# Patient Record
Sex: Female | Born: 1990 | Race: Black or African American | Hispanic: No | Marital: Single | Smoking: Former smoker
Health system: Southern US, Community
[De-identification: ages and names within clinical notes are randomized; demographics above are authoritative.]

## PROBLEM LIST (undated history)

## (undated) ENCOUNTER — Ambulatory Visit: Admission: EM | Payer: Medicaid Other

## (undated) ENCOUNTER — Inpatient Hospital Stay (HOSPITAL_COMMUNITY): Payer: Self-pay

## (undated) DIAGNOSIS — D563 Thalassemia minor: Secondary | ICD-10-CM

## (undated) DIAGNOSIS — J45909 Unspecified asthma, uncomplicated: Secondary | ICD-10-CM

## (undated) DIAGNOSIS — I10 Essential (primary) hypertension: Secondary | ICD-10-CM

## (undated) DIAGNOSIS — J45990 Exercise induced bronchospasm: Secondary | ICD-10-CM

## (undated) DIAGNOSIS — L309 Dermatitis, unspecified: Secondary | ICD-10-CM

## (undated) DIAGNOSIS — R569 Unspecified convulsions: Secondary | ICD-10-CM

## (undated) DIAGNOSIS — Z87898 Personal history of other specified conditions: Secondary | ICD-10-CM

## (undated) HISTORY — PX: NO PAST SURGERIES: SHX2092

## (undated) HISTORY — DX: Unspecified convulsions: R56.9

---
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
## 2011-09-03 LAB — WOUND CULTURE

## 2012-08-08 ENCOUNTER — Inpatient Hospital Stay (HOSPITAL_COMMUNITY): Payer: Medicaid Other

## 2012-08-08 ENCOUNTER — Encounter (HOSPITAL_COMMUNITY): Payer: Self-pay | Admitting: *Deleted

## 2012-08-08 ENCOUNTER — Inpatient Hospital Stay (HOSPITAL_COMMUNITY)
Admission: AD | Admit: 2012-08-08 | Discharge: 2012-08-08 | Disposition: A | Discharge status: Home / Self Care | Payer: Medicaid Other | Source: Ambulatory Visit | Attending: Obstetrics & Gynecology | Admitting: Obstetrics & Gynecology

## 2012-08-08 DIAGNOSIS — O418X90 Other specified disorders of amniotic fluid and membranes, unspecified trimester, not applicable or unspecified: Secondary | ICD-10-CM

## 2012-08-08 DIAGNOSIS — O209 Hemorrhage in early pregnancy, unspecified: Secondary | ICD-10-CM | POA: Insufficient documentation

## 2012-08-08 DIAGNOSIS — O3410 Maternal care for benign tumor of corpus uteri, unspecified trimester: Secondary | ICD-10-CM | POA: Insufficient documentation

## 2012-08-08 DIAGNOSIS — Z3490 Encounter for supervision of normal pregnancy, unspecified, unspecified trimester: Secondary | ICD-10-CM

## 2012-08-08 DIAGNOSIS — D259 Leiomyoma of uterus, unspecified: Secondary | ICD-10-CM

## 2012-08-08 DIAGNOSIS — Z1389 Encounter for screening for other disorder: Secondary | ICD-10-CM

## 2012-08-08 DIAGNOSIS — R109 Unspecified abdominal pain: Secondary | ICD-10-CM | POA: Insufficient documentation

## 2012-08-08 HISTORY — DX: Unspecified asthma, uncomplicated: J45.909

## 2012-08-08 LAB — CBC WITH DIFFERENTIAL/PLATELET
Basophils Absolute: 0 10*3/uL (ref 0.0–0.1)
Basophils Relative: 0 % (ref 0–1)
Eosinophils Absolute: 0.2 10*3/uL (ref 0.0–0.7)
HCT: 39.5 % (ref 36.0–46.0)
Hemoglobin: 12.6 g/dL (ref 12.0–15.0)
MCHC: 31.9 g/dL (ref 30.0–36.0)
Monocytes Relative: 10 % (ref 3–12)
Neutrophils Relative %: 55 % (ref 43–77)
Platelets: 291 10*3/uL (ref 150–400)
RBC: 4.51 MIL/uL (ref 3.87–5.11)
RDW: 13.1 % (ref 11.5–15.5)
WBC: 6.7 10*3/uL (ref 4.0–10.5)

## 2012-08-08 LAB — URINALYSIS, ROUTINE W REFLEX MICROSCOPIC
Glucose, UA: NEGATIVE mg/dL
Ketones, ur: NEGATIVE mg/dL
Leukocytes, UA: NEGATIVE
Nitrite: NEGATIVE
Urobilinogen, UA: 0.2 mg/dL (ref 0.0–1.0)
pH: 6.5 (ref 5.0–8.0)

## 2012-08-08 LAB — WET PREP, GENITAL
Clue Cells Wet Prep HPF POC: NONE SEEN
Trich, Wet Prep: NONE SEEN
WBC, Wet Prep HPF POC: NONE SEEN
Yeast Wet Prep HPF POC: NONE SEEN

## 2012-08-08 LAB — POCT PREGNANCY, URINE: Preg Test, Ur: POSITIVE — AB

## 2012-08-08 LAB — HCG, QUANTITATIVE, PREGNANCY: hCG, Beta Chain, Quant, S: 9849 m[IU]/mL — ABNORMAL HIGH (ref <5)

## 2012-08-08 NOTE — ED Provider Notes (Signed)
History     CSN: [id number]  Arrival date and time: 08/08/12 1138   First Provider Initiated Contact with Patient 08/08/12 1224      Chief Complaint  Patient presents with  . Possible Pregnancy   HPI Pt is a 20 year old g1p0 at 6.0 weeks by LMP who came to the [facility] complaining of no period since Augst 8th, mild crampy abdominal pain, nausea, heartburn, and constipation since the end of august.  She had taken two at home pregnancy tests with one being positive and the other being unreadable. She reports no bleeding, dizzyness, vomiting, fevers, headache, vision changes, or numbness.    OB History    Grav Para Term Preterm Abortions TAB SAB Ect Mult Living   1               Past Medical History  Diagnosis Date  . Asthma     exercise induced    Past Surgical History  Procedure Date  . No past surgeries     History reviewed. No pertinent family history.  History  Substance Use Topics  . Smoking status: Current Every Day Smoker -- 0.2 packs/day    Types: Cigarettes  . Smokeless tobacco: Not on file  . Alcohol Use: No    Allergies: No Known Allergies  No prescriptions prior to admission    ROS See HPI Physical Exam   Blood pressure 138/79, pulse 97, temperature 99.1 F (37.3 C), temperature source Oral, resp. rate 16, height 5\' 6"  (1.676 m), weight 175 lb 9.6 oz (79.652 kg), last menstrual period 06/27/2012, SpO2 100.00%.  Physical Exam  Constitutional: She is oriented to person, place, and time. Vital signs are normal. She appears well-developed and well-nourished. No distress.  HENT:  Head: Normocephalic and atraumatic.  Right Ear: External ear normal.  Left Ear: External ear normal.  Nose: Nose normal.  Eyes: Conjunctivae normal and EOM are normal. Pupils are equal, round, and reactive to light.  Cardiovascular: Normal rate, regular rhythm, S1 normal, S2 normal, normal heart sounds and normal pulses.  Exam reveals no gallop, no S3, no S4 and no friction rub.    No murmur heard.      Unable to palpate pedal pulse, good capillary refill <2 seconds  Respiratory: Effort normal and breath sounds normal. No respiratory distress. She has no wheezes. She has no rales. She exhibits no tenderness.  GI: Soft. Normal appearance and bowel sounds are normal. She exhibits no distension, no pulsatile liver, no abdominal bruit and no mass. There is no hepatosplenomegaly. There is no tenderness. There is no rigidity, no rebound and no guarding.  Genitourinary: Vagina normal and uterus normal. Pelvic exam was performed with patient supine.       External vagina normal with no lesions. Vaginal vault contains thick white discharge, no visible blood or trauma noted.  Cervix is closed and long with no lesions. No adnexal tenderness. Uterus is approx 6 weeks in size. Performed with chaperone.  Neurological: She is alert and oriented to person, place, and time.  Skin: Skin is warm and dry. No rash noted. She is not diaphoretic. No erythema. No pallor.   Results for orders placed during the hospital encounter of 08/08/12 (from the past 24 hour(s))  URINALYSIS, ROUTINE W REFLEX MICROSCOPIC     Status: Normal   Collection Time   08/08/12 11:52 AM      Component Value Range   Color, Urine YELLOW  YELLOW   APPearance CLEAR  CLEAR  Specific Gravity, Urine 1.015  1.005 - 1.030   pH 6.5  5.0 - 8.0   Glucose, UA NEGATIVE  NEGATIVE mg/dL   Hgb urine dipstick NEGATIVE  NEGATIVE   Bilirubin Urine NEGATIVE  NEGATIVE   Ketones, ur NEGATIVE  NEGATIVE mg/dL   Protein, ur NEGATIVE  NEGATIVE mg/dL   Urobilinogen, UA 0.2  0.0 - 1.0 mg/dL   Nitrite NEGATIVE  NEGATIVE   Leukocytes, UA NEGATIVE  NEGATIVE  POCT PREGNANCY, URINE     Status: Abnormal   Collection Time   08/08/12 11:59 AM      Component Value Range   Preg Test, Ur POSITIVE (*) NEGATIVE  CBC WITH DIFFERENTIAL     Status: Normal   Collection Time   08/08/12 12:35 PM      Component Value Range   WBC 6.7  4.0 - 10.5 K/uL    RBC 4.51  3.87 - 5.11 MIL/uL   Hemoglobin 12.6  12.0 - 15.0 g/dL   HCT 16.1  09.6 - 04.5 %   MCV 87.6  78.0 - 100.0 fL   MCH 27.9  26.0 - 34.0 pg   MCHC 31.9  30.0 - 36.0 g/dL   RDW 40.9  81.1 - 91.4 %   Platelets 291  150 - 400 K/uL   Neutrophils Relative 55  43 - 77 %   Neutro Abs 3.7  1.7 - 7.7 K/uL   Lymphocytes Relative 31  12 - 46 %   Lymphs Abs 2.1  0.7 - 4.0 K/uL   Monocytes Relative 10  3 - 12 %   Monocytes Absolute 0.7  0.1 - 1.0 K/uL   Eosinophils Relative 4  0 - 5 %   Eosinophils Absolute 0.2  0.0 - 0.7 K/uL   Basophils Relative 0  0 - 1 %   Basophils Absolute 0.0  0.0 - 0.1 K/uL  HCG, QUANTITATIVE, PREGNANCY     Status: Abnormal   Collection Time   08/08/12 12:35 PM      Component Value Range   hCG, Beta Chain, Quant, S 9849 (*) <5 mIU/mL  ABO/RH     Status: Normal   Collection Time   08/08/12 12:35 PM      Component Value Range   ABO/RH(D) O POS    WET PREP, GENITAL     Status: Normal   Collection Time   08/08/12  1:53 PM      Component Value Range   Yeast Wet Prep HPF POC NONE SEEN  NONE SEEN   Trich, Wet Prep NONE SEEN  NONE SEEN   Clue Cells Wet Prep HPF POC NONE SEEN  NONE SEEN   WBC, Wet Prep HPF POC NONE SEEN  NONE SEEN    MAU Course  Procedures  US shows 6 week IUP with cardiac activity, small fibroid and small [facility] Pelvic exam with CG/Chlamydia + wet prep  Assessment and Plan  IUP @ 6 weeks. Informed of incidental finding if 1cm fibroid and small subchorionic hemorrhage. Follow up for prenatal care.   Doran Heater 08/08/2012, 1:58 PM   I have examined the patient with the student and assisted him with his dx and plan of care.  Discussed with the patient and all questioned fully answered. She will return if any problems arise. I have reviewed this patient's vital signs, nurses notes, appropriate labs and imaging.      [facility] Orlene Och, NP 08/08/12 1600

## 2012-08-08 NOTE — MAU Note (Signed)
Patient states she is late for her period. Nausea, no vomiting. Had some spotting in late August, none since. Has some pain on and off, none now. Just not feeling well.

## 2012-08-08 NOTE — MAU Note (Signed)
Patient states she did 2 home pregnancy tests yesterday, one positive and unable to determine with the 2nd.

## 2012-09-27 ENCOUNTER — Other Ambulatory Visit (HOSPITAL_COMMUNITY): Payer: Self-pay | Admitting: Physician Assistant

## 2012-09-27 DIAGNOSIS — Z3689 Encounter for other specified antenatal screening: Secondary | ICD-10-CM

## 2012-09-27 LAB — OB RESULTS CONSOLE ABO/RH: RH Type: POSITIVE

## 2012-09-27 LAB — OB RESULTS CONSOLE HIV ANTIBODY (ROUTINE TESTING): HIV: NONREACTIVE

## 2012-11-08 ENCOUNTER — Ambulatory Visit (HOSPITAL_COMMUNITY)
Admission: RE | Admit: 2012-11-08 | Discharge: 2012-11-08 | Disposition: A | Discharge status: Home / Self Care | Payer: Medicaid Other | Source: Ambulatory Visit | Attending: Physician Assistant | Admitting: Physician Assistant

## 2012-11-08 DIAGNOSIS — Z1389 Encounter for screening for other disorder: Secondary | ICD-10-CM | POA: Insufficient documentation

## 2012-11-08 DIAGNOSIS — O358XX0 Maternal care for other (suspected) fetal abnormality and damage, not applicable or unspecified: Secondary | ICD-10-CM | POA: Insufficient documentation

## 2012-11-08 DIAGNOSIS — Z363 Encounter for antenatal screening for malformations: Secondary | ICD-10-CM | POA: Insufficient documentation

## 2012-11-08 DIAGNOSIS — Z3689 Encounter for other specified antenatal screening: Secondary | ICD-10-CM

## 2013-03-07 LAB — OB RESULTS CONSOLE GBS: GBS: POSITIVE

## 2013-04-03 ENCOUNTER — Encounter (HOSPITAL_COMMUNITY): Payer: Self-pay | Admitting: *Deleted

## 2013-04-03 ENCOUNTER — Telehealth (HOSPITAL_COMMUNITY): Payer: Self-pay | Admitting: *Deleted

## 2013-04-03 NOTE — Telephone Encounter (Signed)
Preadmission screen  

## 2013-04-05 ENCOUNTER — Inpatient Hospital Stay (HOSPITAL_COMMUNITY)
Admission: AD | Admit: 2013-04-05 | Discharge: 2013-04-09 | DRG: 766 | Disposition: A | Discharge status: Home / Self Care | Payer: Medicaid Other | Source: Ambulatory Visit | Attending: Obstetrics and Gynecology | Admitting: Obstetrics and Gynecology

## 2013-04-05 ENCOUNTER — Encounter (HOSPITAL_COMMUNITY): Payer: Self-pay | Admitting: *Deleted

## 2013-04-05 DIAGNOSIS — Z22330 Carrier of Group B streptococcus: Secondary | ICD-10-CM

## 2013-04-05 DIAGNOSIS — O4290 Premature rupture of membranes, unspecified as to length of time between rupture and onset of labor, unspecified weeks of gestation: Principal | ICD-10-CM | POA: Diagnosis present

## 2013-04-05 DIAGNOSIS — O99892 Other specified diseases and conditions complicating childbirth: Secondary | ICD-10-CM | POA: Diagnosis present

## 2013-04-05 DIAGNOSIS — O34219 Maternal care for unspecified type scar from previous cesarean delivery: Secondary | ICD-10-CM

## 2013-04-05 LAB — URINALYSIS, ROUTINE W REFLEX MICROSCOPIC
Bilirubin Urine: NEGATIVE
Hgb urine dipstick: NEGATIVE
Specific Gravity, Urine: 1.015 (ref 1.005–1.030)
Urobilinogen, UA: 0.2 mg/dL (ref 0.0–1.0)

## 2013-04-05 LAB — CBC
HCT: 40 % (ref 36.0–46.0)
MCHC: 33.3 g/dL (ref 30.0–36.0)
Platelets: 295 10*3/uL (ref 150–400)
RDW: 13.6 % (ref 11.5–15.5)
WBC: 9.6 10*3/uL (ref 4.0–10.5)

## 2013-04-05 LAB — COMPREHENSIVE METABOLIC PANEL
ALT: 21 U/L (ref 0–35)
Albumin: 2.6 g/dL — ABNORMAL LOW (ref 3.5–5.2)
Alkaline Phosphatase: 153 U/L — ABNORMAL HIGH (ref 39–117)
Chloride: 102 mEq/L (ref 96–112)
Glucose, Bld: 130 mg/dL — ABNORMAL HIGH (ref 70–99)
Potassium: 3.9 mEq/L (ref 3.5–5.1)
Sodium: 134 mEq/L — ABNORMAL LOW (ref 135–145)
Total Bilirubin: 0.3 mg/dL (ref 0.3–1.2)
Total Protein: 5.9 g/dL — ABNORMAL LOW (ref 6.0–8.3)

## 2013-04-05 LAB — TYPE AND SCREEN
ABO/RH(D): O POS
Antibody Screen: NEGATIVE

## 2013-04-05 MED ORDER — TERBUTALINE SULFATE 1 MG/ML IJ SOLN: 0.2500 mg | Freq: Once | INTRAMUSCULAR | Status: AC | PRN

## 2013-04-05 MED ORDER — ONDANSETRON HCL 4 MG/2ML IJ SOLN
4.0000 mg | Freq: Four times a day (QID) | INTRAMUSCULAR | Status: DC | PRN
  Administered 2013-04-06 – 2013-04-07 (×3): 4 mg via INTRAVENOUS
  Filled 2013-04-05 (×3): qty 2

## 2013-04-05 MED ORDER — PENICILLIN G POTASSIUM 5000000 UNITS IJ SOLR
5.0000 10*6.[IU] | Freq: Once | INTRAVENOUS | Status: AC
  Administered 2013-04-05: 5 10*6.[IU] via INTRAVENOUS
  Filled 2013-04-05: qty 5

## 2013-04-05 MED ORDER — OXYTOCIN BOLUS FROM INFUSION: 500.0000 mL | INTRAVENOUS | Status: DC

## 2013-04-05 MED ORDER — ZOLPIDEM TARTRATE 5 MG PO TABS
5.0000 mg | ORAL_TABLET | Freq: Every evening | ORAL | Status: DC | PRN
  Administered 2013-04-06 (×2): 5 mg via ORAL
  Filled 2013-04-05 (×2): qty 1

## 2013-04-05 MED ORDER — ACETAMINOPHEN 325 MG PO TABS: 650.0000 mg | ORAL_TABLET | ORAL | Status: DC | PRN

## 2013-04-05 MED ORDER — LIDOCAINE HCL (PF) 1 % IJ SOLN: 30.0000 mL | INTRAMUSCULAR | Status: DC | PRN

## 2013-04-05 MED ORDER — OXYTOCIN 40 UNITS IN LACTATED RINGERS INFUSION - SIMPLE MED: 62.5000 mL/h | INTRAVENOUS | Status: DC

## 2013-04-05 MED ORDER — CITRIC ACID-SODIUM CITRATE 334-500 MG/5ML PO SOLN
30.0000 mL | ORAL | Status: DC | PRN
  Administered 2013-04-06 – 2013-04-07 (×3): 30 mL via ORAL
  Filled 2013-04-05 (×3): qty 15

## 2013-04-05 MED ORDER — MISOPROSTOL 25 MCG QUARTER TABLET
50.0000 ug | ORAL_TABLET | ORAL | Status: DC
  Administered 2013-04-05: 50 ug via ORAL
  Filled 2013-04-05 (×2): qty 0.25

## 2013-04-05 MED ORDER — OXYCODONE-ACETAMINOPHEN 5-325 MG PO TABS: 1.0000 | ORAL_TABLET | ORAL | Status: DC | PRN

## 2013-04-05 MED ORDER — IBUPROFEN 600 MG PO TABS: 600.0000 mg | ORAL_TABLET | Freq: Four times a day (QID) | ORAL | Status: DC | PRN

## 2013-04-05 MED ORDER — PENICILLIN G POTASSIUM 5000000 UNITS IJ SOLR
2.5000 10*6.[IU] | INTRAMUSCULAR | Status: DC
  Administered 2013-04-06 – 2013-04-07 (×10): 2.5 10*6.[IU] via INTRAVENOUS
  Filled 2013-04-05 (×14): qty 2.5

## 2013-04-05 MED ORDER — FENTANYL 2.5 MCG/ML BUPIVACAINE 1/10 % EPIDURAL INFUSION (WH - ANES)
14.0000 mL/h | INTRAMUSCULAR | Status: DC | PRN
  Administered 2013-04-07 (×2): 14 mL/h via EPIDURAL
  Filled 2013-04-05 (×3): qty 125

## 2013-04-05 MED ORDER — EPHEDRINE 5 MG/ML INJ: 10.0000 mg | INTRAVENOUS | Status: DC | PRN

## 2013-04-05 MED ORDER — PHENYLEPHRINE 40 MCG/ML (10ML) SYRINGE FOR IV PUSH (FOR BLOOD PRESSURE SUPPORT): 80.0000 ug | PREFILLED_SYRINGE | INTRAVENOUS | Status: DC | PRN

## 2013-04-05 MED ORDER — FLEET ENEMA 7-19 GM/118ML RE ENEM: 1.0000 | ENEMA | RECTAL | Status: DC | PRN

## 2013-04-05 MED ORDER — EPHEDRINE 5 MG/ML INJ
10.0000 mg | INTRAVENOUS | Status: DC | PRN
  Filled 2013-04-05: qty 4

## 2013-04-05 MED ORDER — LACTATED RINGERS IV SOLN: 500.0000 mL | Freq: Once | INTRAVENOUS | Status: DC

## 2013-04-05 MED ORDER — FENTANYL CITRATE 0.05 MG/ML IJ SOLN
100.0000 ug | INTRAMUSCULAR | Status: DC | PRN
  Administered 2013-04-06 (×4): 100 ug via INTRAVENOUS
  Filled 2013-04-05 (×5): qty 2

## 2013-04-05 MED ORDER — LACTATED RINGERS IV SOLN
500.0000 mL | INTRAVENOUS | Status: DC | PRN
  Administered 2013-04-07 (×2): 500 mL via INTRAVENOUS
  Administered 2013-04-07: 1000 mL via INTRAVENOUS

## 2013-04-05 MED ORDER — DIPHENHYDRAMINE HCL 50 MG/ML IJ SOLN: 12.5000 mg | INTRAMUSCULAR | Status: DC | PRN

## 2013-04-05 MED ORDER — LACTATED RINGERS IV SOLN
INTRAVENOUS | Status: DC
  Administered 2013-04-05 – 2013-04-06 (×2): via INTRAVENOUS
  Administered 2013-04-06: 125 mL/h via INTRAVENOUS
  Administered 2013-04-07 (×2): via INTRAVENOUS

## 2013-04-05 MED ORDER — PHENYLEPHRINE 40 MCG/ML (10ML) SYRINGE FOR IV PUSH (FOR BLOOD PRESSURE SUPPORT)
80.0000 ug | PREFILLED_SYRINGE | INTRAVENOUS | Status: DC | PRN
  Filled 2013-04-05: qty 5

## 2013-04-05 NOTE — MAU Provider Note (Signed)
  History     CSN: [id number]  Arrival date and time: 04/05/13 1824   None     No chief complaint on file.  HPI Alison Archer is a 22 y.o. female G1P0 who presents today concerned for her water breaking.  She states that she has felt an increasing discharge for the last two days.  [facility] she was convinced to present now.  She admits to feeling the baby move regularly; she denies cramping or contractions and denies vaginal bleeding.    OB History   Grav Para Term Preterm Abortions TAB SAB Ect Mult Living   1               Past Medical History  Diagnosis Date  . Asthma     exercise induced  . Seizures     febrile as a child    Past Surgical History  Procedure Laterality Date  . No past surgeries      Family History  Problem Relation Age of Onset  . Hypertension Mother   . Hypertension Father   . Asthma Sister   . Hypertension Sister   . Diabetes Maternal Grandmother     History  Substance Use Topics  . Smoking status: Former Smoker -- 0.25 packs/day    Types: Cigarettes    Quit date: 08/04/2012  . Smokeless tobacco: Not on file  . Alcohol Use: No    Allergies: No Known Allergies  Prescriptions prior to admission  Medication Sig Dispense Refill  . Prenatal Vit-Fe Fumarate-FA (PRENATAL MULTIVITAMIN) TABS Take 1 tablet by mouth daily at 12 noon.        Review of Systems  Constitutional: Negative for fever and chills.  Respiratory: Negative for cough.   Cardiovascular: Negative for chest pain.  Gastrointestinal: Negative for nausea, vomiting and abdominal pain.  Neurological: Negative for headaches.   Physical Exam   Last menstrual period 06/27/2012, unknown if currently breastfeeding.  Physical Exam  Genitourinary: There is no rash, tenderness, lesion or injury on the right labia. There is no rash, tenderness, lesion or injury on the left labia. No vaginal discharge found.  There is a clear mucus discharge  from the cervix.      MAU Course  Procedures  Sample of the cervical discharge under microscope revealed ferning Point of Care ultrasound in room confirmed vertex presentation.   Assessment and Plan  PROM- 1) Admit to L&D for induction of labor 2) Start cytotec oral   Anna Genre 04/05/2013, 7:31 PM

## 2013-04-05 NOTE — H&P (Signed)
Alison Archer is a 22 y.o. female presenting for "Leaking fluid for 2 days,off and on".  Finally called MD, and was advised to come in. Mild contractions q 10-13 minutes. Maternal Medical History:  Reason for admission: Rupture of membranes.   Fetal activity: Perceived fetal activity is normal.      OB History   Grav Para Term Preterm Abortions TAB SAB Ect Mult Living   1              Past Medical History  Diagnosis Date  . Asthma     exercise induced  . Seizures     febrile as a child   Past Surgical History  Procedure Laterality Date  . No past surgeries     Family History: family history includes Asthma in her sister; Diabetes in her maternal grandmother; and Hypertension in her father, mother, and sister. Social History:  reports that she quit smoking about 8 months ago. Her smoking use included Cigarettes. She smoked 0.25 packs per day. She does not have any smokeless tobacco history on file. She reports that she uses illicit drugs (Marijuana). She reports that she does not drink alcohol.   Prenatal Transfer Tool  Maternal Diabetes: No Genetic Screening: Normal Maternal Ultrasounds/Referrals: Normal Fetal Ultrasounds or other Referrals:  None Maternal Substance Abuse:  No Significant Maternal Medications:  None Significant Maternal Lab Results:  Lab values include: Group B Strep positive Other Comments:  PROM since 04/03/13  Review of Systems  Constitutional: Negative.   Eyes: Negative.  Negative for blurred vision.  Respiratory: Negative.   Cardiovascular: Positive for leg swelling.  Gastrointestinal: Negative.   Genitourinary: Negative.   Musculoskeletal: Negative.   Neurological: Negative.  Negative for headaches.  Endo/Heme/Allergies: Negative.   Psychiatric/Behavioral: Negative.       Blood pressure 137/85, pulse 90, last menstrual period 06/27/2012, unknown if currently breastfeeding.   Fetal Exam Fetal Monitor Review: Baseline rate: 130.   Variability: moderate (6-25 bpm).   Pattern: accelerations present and no decelerations.    Fetal State Assessment: Category I - tracings are normal.     Physical Exam  Constitutional: She is oriented to person, place, and time. She appears well-developed and well-nourished.  Neck: No thyromegaly present.  Cardiovascular: Normal rate, regular rhythm and normal heart sounds.   No murmur heard. Respiratory: Breath sounds normal.  Genitourinary:  Clear fluid, positive ferning  Musculoskeletal: She exhibits edema.  2+ pitting lower legs and feet, mild swelling of hands  Neurological: She is alert and oriented to person, place, and time. She has normal reflexes.  Skin: Skin is warm and dry.  Psychiatric: She has a normal mood and affect. Her behavior is normal. Judgment and thought content normal.    Prenatal labs: ABO, Rh: O/Positive/-- (11/06 0000) Antibody: Negative (11/06 0000) Rubella: Immune (11/06 0000) RPR: Nonreactive (11/06 0000)  HBsAg: Negative (11/06 0000)  HIV: Non-reactive (11/06 0000)  GBS: Positive (04/16 0000)  1hr GTT: 85 Assessment/Plan: PROM X 2 days Cytotec orally   CRESENZO-DISHMAN,Rochele Lueck 04/05/2013, 8:00 PM

## 2013-04-05 NOTE — Progress Notes (Signed)
Alison Archer is a 22 y.o. G1P0 at [redacted]w[redacted]d  admitted for PROM X 2 days  Subjective: Starting to feel mild contractions  Objective: BP 154/88  Pulse 85  Temp(Src) 99.2 F (37.3 C) (Oral)  Resp 18  Ht 5\' 5"  (1.651 m)  Wt 104.327 kg (230 lb)  BMI 38.27 kg/m2  LMP 06/27/2012  Breastfeeding? Unknown   Filed Vitals:   04/05/13 1854 04/05/13 1933 04/05/13 2036 04/05/13 2040  BP: 140/89 137/85 154/88 154/88  Pulse: 99 90 85 85  Temp:    99.2 F (37.3 C)  TempSrc:    Oral  Resp:    18  Height:    5\' 5"  (1.651 m)  Weight:    104.327 kg (230 lb)    FHT:  FHR: 130 bpm, variability: moderate,  accelerations:  Present,  decelerations:  Absent UC:   irregular, every 5-10 minutes SVE:   Dilation: Closed Effacement (%): 0 Station: -3 Exam by:: GPayne, RN S/P 1 cytotec Labs:  Results for orders placed during the hospital encounter of 04/05/13 (from the past 24 hour(s))  URINALYSIS, ROUTINE W REFLEX MICROSCOPIC     Status: Abnormal   Collection Time    04/05/13  8:10 PM      Result Value Range   Color, Urine YELLOW  YELLOW   APPearance CLEAR  CLEAR   Specific Gravity, Urine 1.015  1.005 - 1.030   pH 6.5  5.0 - 8.0   Glucose, UA NEGATIVE  NEGATIVE mg/dL   Hgb urine dipstick NEGATIVE  NEGATIVE   Bilirubin Urine NEGATIVE  NEGATIVE   Ketones, ur 15 (*) NEGATIVE mg/dL   Protein, ur NEGATIVE  NEGATIVE mg/dL   Urobilinogen, UA 0.2  0.0 - 1.0 mg/dL   Nitrite NEGATIVE  NEGATIVE   Leukocytes, UA NEGATIVE  NEGATIVE  CBC     Status: None   Collection Time    04/05/13  9:00 PM      Result Value Range   WBC 9.6  4.0 - 10.5 K/uL   RBC 4.59  3.87 - 5.11 MIL/uL   Hemoglobin 13.3  12.0 - 15.0 g/dL   HCT 13.0  86.5 - 78.4 %   MCV 87.1  78.0 - 100.0 fL   MCH 29.0  26.0 - 34.0 pg   MCHC 33.3  30.0 - 36.0 g/dL   RDW 69.6  29.5 - 28.4 %   Platelets 295  150 - 400 K/uL  TYPE AND SCREEN     Status: None   Collection Time    04/05/13 10:05 PM      Result Value Range   ABO/RH(D) O POS      Antibody Screen NEG     Sample Expiration 04/08/2013    COMPREHENSIVE METABOLIC PANEL     Status: Abnormal   Collection Time    04/05/13 10:05 PM      Result Value Range   Sodium 134 (*) 135 - 145 mEq/L   Potassium 3.9  3.5 - 5.1 mEq/L   Chloride 102  96 - 112 mEq/L   CO2 23  19 - 32 mEq/L   Glucose, Bld 130 (*) 70 - 99 mg/dL   BUN 6  6 - 23 mg/dL   Creatinine, Ser 1.32  0.50 - 1.10 mg/dL   Calcium 9.4  8.4 - 44.0 mg/dL   Total Protein 5.9 (*) 6.0 - 8.3 g/dL   Albumin 2.6 (*) 3.5 - 5.2 g/dL   AST 25  0 - 37 U/L  ALT 21  0 - 35 U/L   Alkaline Phosphatase 153 (*) 39 - 117 U/L   Total Bilirubin 0.3  0.3 - 1.2 mg/dL   GFR calc non Af Amer >90  >90 mL/min   GFR calc Af Amer >90  >90 mL/min    Assessment / Plan: PROM X 2 days, IOL, continue oral cytotec  Labor: ripening phase Fetal Wellbeing:  Category I Pain Control:  Labor support without medications Anticipated MOD:  NSVD  Archer,Alison Amoroso 04/05/2013, 11:09 PM

## 2013-04-06 ENCOUNTER — Encounter (HOSPITAL_COMMUNITY): Payer: Self-pay | Admitting: *Deleted

## 2013-04-06 ENCOUNTER — Other Ambulatory Visit: Payer: Medicaid Other

## 2013-04-06 LAB — COMPREHENSIVE METABOLIC PANEL
Albumin: 2.5 g/dL — ABNORMAL LOW (ref 3.5–5.2)
Alkaline Phosphatase: 149 U/L — ABNORMAL HIGH (ref 39–117)
BUN: 4 mg/dL — ABNORMAL LOW (ref 6–23)
Potassium: 3.7 mEq/L (ref 3.5–5.1)
Sodium: 134 mEq/L — ABNORMAL LOW (ref 135–145)
Total Protein: 6.2 g/dL (ref 6.0–8.3)

## 2013-04-06 LAB — CBC
HCT: 34.6 % — ABNORMAL LOW (ref 36.0–46.0)
Hemoglobin: 11.2 g/dL — ABNORMAL LOW (ref 12.0–15.0)
MCV: 87.8 fL (ref 78.0–100.0)
RDW: 13.4 % (ref 11.5–15.5)
WBC: 9.2 10*3/uL (ref 4.0–10.5)

## 2013-04-06 LAB — RPR: RPR Ser Ql: NONREACTIVE

## 2013-04-06 LAB — PROTEIN / CREATININE RATIO, URINE: Protein Creatinine Ratio: 0.18 — ABNORMAL HIGH (ref 0.00–0.15)

## 2013-04-06 MED ORDER — OXYTOCIN 10 UNIT/ML IJ SOLN
INTRAMUSCULAR | Status: AC
  Filled 2013-04-06: qty 4

## 2013-04-06 MED ORDER — MISOPROSTOL 25 MCG QUARTER TABLET
50.0000 ug | ORAL_TABLET | Freq: Once | ORAL | Status: AC
  Administered 2013-04-06: 50 ug via ORAL
  Filled 2013-04-06: qty 0.5

## 2013-04-06 MED ORDER — MORPHINE SULFATE 0.5 MG/ML IJ SOLN
INTRAMUSCULAR | Status: AC
  Filled 2013-04-06: qty 10

## 2013-04-06 MED ORDER — MISOPROSTOL 25 MCG QUARTER TABLET: 50.0000 ug | ORAL_TABLET | Freq: Once | ORAL | Status: DC

## 2013-04-06 MED ORDER — ONDANSETRON HCL 4 MG/2ML IJ SOLN
INTRAMUSCULAR | Status: AC
  Filled 2013-04-06: qty 2

## 2013-04-06 MED ORDER — FENTANYL CITRATE 0.05 MG/ML IJ SOLN
INTRAMUSCULAR | Status: AC
  Filled 2013-04-06: qty 2

## 2013-04-06 MED ORDER — MISOPROSTOL 50MCG HALF TABLET
50.0000 ug | ORAL_TABLET | Freq: Once | ORAL | Status: AC
  Administered 2013-04-06: 50 ug via ORAL
  Filled 2013-04-06: qty 1

## 2013-04-06 NOTE — Anesthesia Preprocedure Evaluation (Addendum)
Anesthesia Evaluation  Patient identified by MRN, date of birth, ID band Patient awake    Reviewed: Allergy & Precautions, H&P , NPO status , Patient's Chart, lab work & pertinent test results  Airway Mallampati: II TM Distance: >3 FB Neck ROM: full    Dental no notable dental hx. (+) Dental Advisory Given   Pulmonary    Pulmonary exam normal       Cardiovascular negative cardio ROS      Neuro/Psych negative psych ROS   GI/Hepatic negative GI ROS, Neg liver ROS,   Endo/Other  Morbid obesity  Renal/GU negative Renal ROS  negative genitourinary   Musculoskeletal negative musculoskeletal ROS (+)   Abdominal Normal abdominal exam  (+)   Peds negative pediatric ROS (+)  Hematology negative hematology ROS (+)   Anesthesia Other Findings   Reproductive/Obstetrics (+) Pregnancy                          Anesthesia Physical Anesthesia Plan  ASA: III  Anesthesia Plan: Epidural   Post-op Pain Management:    Induction:   Airway Management Planned:   Additional Equipment:   Intra-op Plan:   Post-operative Plan:   Informed Consent: I have reviewed the patients History and Physical, chart, labs and discussed the procedure including the risks, benefits and alternatives for the proposed anesthesia with the patient or authorized representative who has indicated his/her understanding and acceptance.   Dental advisory given  Plan Discussed with: CRNA  Anesthesia Plan Comments:        Anesthesia Quick Evaluation

## 2013-04-06 NOTE — Progress Notes (Signed)
Patient ID: Alison Archer, female   DOB: 15-Mar-1991, 22 y.o.   MRN: [id number]  S:  Pt uncomfortable with contractions. S/p 3 doses PO cytotec. Still leaking fluid.  O: Filed Vitals:   04/06/13 1355 04/06/13 1400 04/06/13 1457 04/06/13 1536  BP:   134/81   Pulse:   75   Temp:   98.3 F (36.8 C)   TempSrc:   Oral   Resp: 18 20 18 20   Height:      Weight:        Cervix:  1-2/ 50% / -3 / mid  Foley bulb placed easily, filled with 60 ml LR  FHTs:  115-120, moderate variability, accels present, no decels TOCO: q 2 -4 min  A/P 21 y.o. G1P0000 at [redacted]w[redacted]d with SROM 3 days ago. IOL. - S/p 3 doses cytotec - Foley bulb placed - Contracting somewhat regularly - Anticipate SVD - Afebrile, FHTs reassuring   Napoleon Form, MD

## 2013-04-06 NOTE — Progress Notes (Signed)
Dr. Thad Ranger at bedside and explained to the pt the need to remove the foley bulb due to FHR tracing.  Pt verbalized understandings.  Will continue to monitor.

## 2013-04-06 NOTE — Progress Notes (Signed)
Patient ID: Alison Archer, female   DOB: 1991/03/04, 22 y.o.   MRN: [id number]  S:  Pt uncomfortable with ctx.  Called to room for prolonged decel.  O:   Filed Vitals:   04/06/13 1536  BP:   Pulse:   Temp:   Resp: 20    Decel to 60-90 for 4.5 minutes after foley bulb placed. Then another decel 15 minutes later to 60s for at least 3 minutes. Pt repositioned, given O2, bolus. Seemed to recover to baseline. Then 2 deep variables (to 60-90s) for 1.5 min each that were late in timing - after ctx lasting 2.5-3 min. Foley bulb removed and baby seemed to recover.  A/P Will recheck in 1 hour. If not changing, will attempt to replace FB.  Napoleon Form, MD

## 2013-04-06 NOTE — H&P (Signed)
Attestation of Attending Supervision of Advanced Practitioner (CNM/NP): Evaluation and management procedures were performed by the Advanced Practitioner under my supervision and collaboration.  I have reviewed the Advanced Practitioner's note and chart, and I agree with the management and plan.  Auriah Hollings 04/06/2013 7:19 AM

## 2013-04-06 NOTE — Progress Notes (Signed)
Called Drenda Freeze re: variables on FHR tracing. Instructed to wait for 20 minutes before administering Cytotec PO.

## 2013-04-06 NOTE — Progress Notes (Signed)
Patient ID: Alison Archer, female   DOB: 03-23-1991, 22 y.o.   MRN: [id number]   S:  Pt states very uncomfortable with contractions, breathing heavily through them.  O:   Filed Vitals:   04/06/13 1650 04/06/13 1655 04/06/13 1704 04/06/13 1803  BP:   142/93   Pulse:   86   Temp:   98.1 F (36.7 C)   TempSrc:   Oral   Resp: 18 20 18 20   Height:      Weight:        CERV:  1.5/ 50-60/ -3 FHTs:  140, mod var, accels present, no decels in past hour TOCO:  q 2-5, some couplets  A/P 21 y.o. G1P0000 at [redacted]w[redacted]d here with PROM x 3 days.  - s/p 3 doses of cytotec. - Foley bulb placed again - Has had intermittent deep decels - variables/lates but none in past hour.  Monitor FHTs closely - Afebrile - Several elevated BP in 140-150s/70-80s.  Will get PIH labs.  No HA, vision changes or RUQ pain.  Napoleon Form, MD

## 2013-04-06 NOTE — Progress Notes (Signed)
   Cedra S Buffalo is a 22 y.o. G1P0 at [redacted]w[redacted]d  admitted for PROM X 3 days Subjective: Mild contractions  Objective: BP 149/79  Pulse 89  Temp(Src) 97.8 F (36.6 C) (Axillary)  Resp 18  Ht 5\' 5"  (1.651 m)  Wt 230 lb (104.327 kg)  BMI 38.27 kg/m2  LMP 06/27/2012  Breastfeeding? Unknown   Filed Vitals:   04/05/13 2040 04/05/13 2309 04/06/13 0300 04/06/13 0347  BP: 154/88 156/86  149/79  Pulse: 85 79  89  Temp: 99.2 F (37.3 C) 97.8 F (36.6 C) 97.8 F (36.6 C)   TempSrc: Oral Axillary Axillary   Resp: 18 18  18   Height: 5\' 5"  (1.651 m)     Weight: 230 lb (104.327 kg)       FHT:  FHR: 140 bpm, variability: moderate,  accelerations:  Present,  decelerations:  Present Had 2 60 sec variables down to 70's, recovered after position change UC:   irregular, every 5-7 minutes SVE:   Dilation: Closed Effacement (%): 0 Station: -3 Exam by:: GPayne, RN  Labs: Lab Results  Component Value Date   WBC 9.6 04/05/2013   HGB 13.3 04/05/2013   HCT 40.0 04/05/2013   MCV 87.1 04/05/2013   PLT 295 04/05/2013    Assessment / Plan: PROM x 3 days, ripening phase of IOL Administer 2nd oral cytotec Labor: no Fetal Wellbeing:  Category I Pain Control:  Labor support without medications Anticipated MOD:  NSVD  CRESENZO-DISHMAN,Tatem Fesler 04/06/2013, 6:18 AM

## 2013-04-06 NOTE — Progress Notes (Signed)
1510 FHR gradually decreased to 60-90 bpm for 2 minutes then gradually retunred to baseline. Pt repositioned, Dr. Thad Ranger notified. 1529 FHR gradually decreased to 70-90 bpm for 2 minutes then gradually returned to baseline.  Dr. Thad Ranger at bedside.  Will continue to monitor.

## 2013-04-06 NOTE — MAU Provider Note (Signed)
Attestation of Attending Supervision of Advanced Practitioner (CNM/NP): Evaluation and management procedures were performed by the Advanced Practitioner under my supervision and collaboration.  I have reviewed the Advanced Practitioner's note and chart, and I agree with the management and plan.  Luna Audia 04/06/2013 7:20 AM

## 2013-04-07 ENCOUNTER — Encounter (HOSPITAL_COMMUNITY): Payer: Self-pay | Admitting: *Deleted

## 2013-04-07 ENCOUNTER — Encounter (HOSPITAL_COMMUNITY): Payer: Self-pay | Admitting: Anesthesiology

## 2013-04-07 ENCOUNTER — Encounter (HOSPITAL_COMMUNITY)
Admission: AD | Disposition: A | Discharge status: Home / Self Care | Payer: Self-pay | Source: Ambulatory Visit | Attending: Obstetrics and Gynecology

## 2013-04-07 ENCOUNTER — Inpatient Hospital Stay (HOSPITAL_COMMUNITY): Payer: Medicaid Other | Admitting: Anesthesiology

## 2013-04-07 DIAGNOSIS — O99892 Other specified diseases and conditions complicating childbirth: Secondary | ICD-10-CM

## 2013-04-07 DIAGNOSIS — O4290 Premature rupture of membranes, unspecified as to length of time between rupture and onset of labor, unspecified weeks of gestation: Secondary | ICD-10-CM

## 2013-04-07 DIAGNOSIS — O9989 Other specified diseases and conditions complicating pregnancy, childbirth and the puerperium: Secondary | ICD-10-CM

## 2013-04-07 SURGERY — Surgical Case
Anesthesia: Epidural | Site: Abdomen | Wound class: Clean Contaminated

## 2013-04-07 MED ORDER — ONDANSETRON HCL 4 MG PO TABS: 4.0000 mg | ORAL_TABLET | ORAL | Status: DC | PRN

## 2013-04-07 MED ORDER — PNEUMOCOCCAL VAC POLYVALENT 25 MCG/0.5ML IJ INJ
0.5000 mL | INJECTION | INTRAMUSCULAR | Status: AC
  Administered 2013-04-08: 0.5 mL via INTRAMUSCULAR
  Filled 2013-04-07: qty 0.5

## 2013-04-07 MED ORDER — EPHEDRINE 5 MG/ML INJ
INTRAVENOUS | Status: AC
  Filled 2013-04-07: qty 10

## 2013-04-07 MED ORDER — MEPERIDINE HCL 25 MG/ML IJ SOLN: 6.2500 mg | INTRAMUSCULAR | Status: DC | PRN

## 2013-04-07 MED ORDER — KETOROLAC TROMETHAMINE 60 MG/2ML IM SOLN
60.0000 mg | Freq: Once | INTRAMUSCULAR | Status: AC | PRN
  Administered 2013-04-07: 60 mg via INTRAMUSCULAR

## 2013-04-07 MED ORDER — SIMETHICONE 80 MG PO CHEW: 80.0000 mg | CHEWABLE_TABLET | ORAL | Status: DC | PRN

## 2013-04-07 MED ORDER — MEASLES, MUMPS & RUBELLA VAC ~~LOC~~ INJ: 0.5000 mL | INJECTION | Freq: Once | SUBCUTANEOUS | Status: DC

## 2013-04-07 MED ORDER — OXYTOCIN 10 UNIT/ML IJ SOLN
40.0000 [IU] | INTRAVENOUS | Status: DC | PRN
  Administered 2013-04-07: 40 [IU] via INTRAVENOUS

## 2013-04-07 MED ORDER — OXYTOCIN 40 UNITS IN LACTATED RINGERS INFUSION - SIMPLE MED: 62.5000 mL/h | INTRAVENOUS | Status: AC

## 2013-04-07 MED ORDER — SODIUM CHLORIDE 0.45 % IV BOLUS: 1000.0000 mL | Freq: Once | INTRAVENOUS | Status: DC

## 2013-04-07 MED ORDER — NALBUPHINE HCL 10 MG/ML IJ SOLN: 5.0000 mg | INTRAMUSCULAR | Status: DC | PRN

## 2013-04-07 MED ORDER — NALOXONE HCL 1 MG/ML IJ SOLN: 1.0000 ug/kg/h | INTRAVENOUS | Status: DC | PRN

## 2013-04-07 MED ORDER — LANOLIN HYDROUS EX OINT: 1.0000 "application " | TOPICAL_OINTMENT | CUTANEOUS | Status: DC | PRN

## 2013-04-07 MED ORDER — NALOXONE HCL 0.4 MG/ML IJ SOLN: 0.4000 mg | INTRAMUSCULAR | Status: DC | PRN

## 2013-04-07 MED ORDER — DIPHENHYDRAMINE HCL 50 MG/ML IJ SOLN: 12.5000 mg | INTRAMUSCULAR | Status: DC | PRN

## 2013-04-07 MED ORDER — CHLOROPROCAINE HCL 3 % IJ SOLN
INTRAMUSCULAR | Status: AC
  Filled 2013-04-07: qty 20

## 2013-04-07 MED ORDER — LABETALOL HCL 5 MG/ML IV SOLN
20.0000 mg | INTRAVENOUS | Status: DC | PRN
  Filled 2013-04-07: qty 16

## 2013-04-07 MED ORDER — PRENATAL MULTIVITAMIN CH
1.0000 | ORAL_TABLET | Freq: Every day | ORAL | Status: DC
  Administered 2013-04-08 – 2013-04-09 (×2): 1 via ORAL
  Filled 2013-04-07 (×2): qty 1

## 2013-04-07 MED ORDER — FENTANYL CITRATE 0.05 MG/ML IJ SOLN
INTRAMUSCULAR | Status: DC | PRN
  Administered 2013-04-07: 100 ug via INTRAVENOUS

## 2013-04-07 MED ORDER — LACTATED RINGERS IV SOLN
INTRAVENOUS | Status: DC | PRN
  Administered 2013-04-07 (×2): via INTRAVENOUS

## 2013-04-07 MED ORDER — DEXTROSE 5 % IV SOLN
3.0000 g | INTRAVENOUS | Status: DC | PRN
  Administered 2013-04-07: 3 g via INTRAVENOUS

## 2013-04-07 MED ORDER — PHENYLEPHRINE 40 MCG/ML (10ML) SYRINGE FOR IV PUSH (FOR BLOOD PRESSURE SUPPORT)
PREFILLED_SYRINGE | INTRAVENOUS | Status: AC
  Filled 2013-04-07: qty 5

## 2013-04-07 MED ORDER — ACETAMINOPHEN 10 MG/ML IV SOLN: 1000.0000 mg | Freq: Once | INTRAVENOUS | Status: DC | PRN

## 2013-04-07 MED ORDER — FENTANYL CITRATE 0.05 MG/ML IJ SOLN
INTRAMUSCULAR | Status: AC
  Filled 2013-04-07: qty 2

## 2013-04-07 MED ORDER — SODIUM BICARBONATE 8.4 % IV SOLN
INTRAVENOUS | Status: DC | PRN
  Administered 2013-04-07: 2 mL via EPIDURAL
  Administered 2013-04-07: 5 mL via EPIDURAL
  Administered 2013-04-07: 3 mL via EPIDURAL
  Administered 2013-04-07: 5 mL via EPIDURAL

## 2013-04-07 MED ORDER — ZOLPIDEM TARTRATE 5 MG PO TABS: 5.0000 mg | ORAL_TABLET | Freq: Every evening | ORAL | Status: DC | PRN

## 2013-04-07 MED ORDER — PROMETHAZINE HCL 25 MG/ML IJ SOLN
INTRAMUSCULAR | Status: AC
  Administered 2013-04-07: 6.25 mg via INTRAVENOUS
  Filled 2013-04-07: qty 1

## 2013-04-07 MED ORDER — BUPIVACAINE HCL (PF) 0.5 % IJ SOLN
INTRAMUSCULAR | Status: DC | PRN
  Administered 2013-04-07: 30 mL

## 2013-04-07 MED ORDER — DIPHENHYDRAMINE HCL 50 MG/ML IJ SOLN: 25.0000 mg | INTRAMUSCULAR | Status: DC | PRN

## 2013-04-07 MED ORDER — ONDANSETRON HCL 4 MG/2ML IJ SOLN
INTRAMUSCULAR | Status: DC | PRN
  Administered 2013-04-07: 4 mg via INTRAVENOUS

## 2013-04-07 MED ORDER — DIPHENHYDRAMINE HCL 25 MG PO CAPS: 25.0000 mg | ORAL_CAPSULE | ORAL | Status: DC | PRN

## 2013-04-07 MED ORDER — MEPERIDINE HCL 25 MG/ML IJ SOLN
INTRAMUSCULAR | Status: AC
  Administered 2013-04-07: 6.25 mg via INTRAVENOUS
  Filled 2013-04-07: qty 1

## 2013-04-07 MED ORDER — SODIUM CHLORIDE 0.9 % IR SOLN
Status: DC | PRN
  Administered 2013-04-07: 1000 mL

## 2013-04-07 MED ORDER — DEXTROSE 5 % IV SOLN: 3.0000 g | INTRAVENOUS | Status: DC

## 2013-04-07 MED ORDER — ONDANSETRON HCL 4 MG/2ML IJ SOLN
4.0000 mg | INTRAMUSCULAR | Status: DC | PRN
  Administered 2013-04-07: 4 mg via INTRAVENOUS
  Filled 2013-04-07: qty 2

## 2013-04-07 MED ORDER — DIBUCAINE 1 % RE OINT: 1.0000 "application " | TOPICAL_OINTMENT | RECTAL | Status: DC | PRN

## 2013-04-07 MED ORDER — MENTHOL 3 MG MT LOZG: 1.0000 | LOZENGE | OROMUCOSAL | Status: DC | PRN

## 2013-04-07 MED ORDER — HYDROMORPHONE HCL PF 1 MG/ML IJ SOLN: 0.2500 mg | INTRAMUSCULAR | Status: DC | PRN

## 2013-04-07 MED ORDER — METOCLOPRAMIDE HCL 5 MG/ML IJ SOLN: 10.0000 mg | Freq: Three times a day (TID) | INTRAMUSCULAR | Status: DC | PRN

## 2013-04-07 MED ORDER — KETOROLAC TROMETHAMINE 30 MG/ML IJ SOLN
30.0000 mg | Freq: Four times a day (QID) | INTRAMUSCULAR | Status: AC | PRN
  Administered 2013-04-07: 30 mg via INTRAVENOUS
  Filled 2013-04-07: qty 1

## 2013-04-07 MED ORDER — KETOROLAC TROMETHAMINE 60 MG/2ML IM SOLN
INTRAMUSCULAR | Status: AC
  Filled 2013-04-07: qty 2

## 2013-04-07 MED ORDER — OXYTOCIN 40 UNITS IN LACTATED RINGERS INFUSION - SIMPLE MED
1.0000 m[IU]/min | INTRAVENOUS | Status: DC
  Administered 2013-04-07: 2 m[IU]/min via INTRAVENOUS
  Filled 2013-04-07: qty 1000

## 2013-04-07 MED ORDER — KETOROLAC TROMETHAMINE 30 MG/ML IJ SOLN: 30.0000 mg | Freq: Four times a day (QID) | INTRAMUSCULAR | Status: AC | PRN

## 2013-04-07 MED ORDER — OXYCODONE-ACETAMINOPHEN 5-325 MG PO TABS
1.0000 | ORAL_TABLET | ORAL | Status: DC | PRN
  Administered 2013-04-08: 1 via ORAL
  Administered 2013-04-08: 2 via ORAL
  Administered 2013-04-08 – 2013-04-09 (×2): 1 via ORAL
  Administered 2013-04-09: 2 via ORAL
  Filled 2013-04-07 (×2): qty 1
  Filled 2013-04-07 (×2): qty 2
  Filled 2013-04-07: qty 1

## 2013-04-07 MED ORDER — SCOPOLAMINE 1 MG/3DAYS TD PT72: 1.0000 | MEDICATED_PATCH | Freq: Once | TRANSDERMAL | Status: DC

## 2013-04-07 MED ORDER — TERBUTALINE SULFATE 1 MG/ML IJ SOLN: 0.2500 mg | Freq: Once | INTRAMUSCULAR | Status: DC | PRN

## 2013-04-07 MED ORDER — BUPIVACAINE HCL (PF) 0.5 % IJ SOLN
INTRAMUSCULAR | Status: AC
  Filled 2013-04-07: qty 30

## 2013-04-07 MED ORDER — LACTATED RINGERS IV SOLN: INTRAVENOUS | Status: DC

## 2013-04-07 MED ORDER — SENNOSIDES-DOCUSATE SODIUM 8.6-50 MG PO TABS
2.0000 | ORAL_TABLET | Freq: Every evening | ORAL | Status: DC | PRN
  Administered 2013-04-08: 2 via ORAL

## 2013-04-07 MED ORDER — SCOPOLAMINE 1 MG/3DAYS TD PT72
MEDICATED_PATCH | TRANSDERMAL | Status: AC
  Administered 2013-04-07: 1.5 mg via TRANSDERMAL
  Filled 2013-04-07: qty 1

## 2013-04-07 MED ORDER — PROMETHAZINE HCL 25 MG/ML IJ SOLN: 6.2500 mg | INTRAMUSCULAR | Status: DC | PRN

## 2013-04-07 MED ORDER — SIMETHICONE 80 MG PO CHEW
80.0000 mg | CHEWABLE_TABLET | Freq: Three times a day (TID) | ORAL | Status: DC
  Administered 2013-04-08 – 2013-04-09 (×4): 80 mg via ORAL

## 2013-04-07 MED ORDER — DIPHENHYDRAMINE HCL 25 MG PO CAPS: 25.0000 mg | ORAL_CAPSULE | Freq: Four times a day (QID) | ORAL | Status: DC | PRN

## 2013-04-07 MED ORDER — MORPHINE SULFATE (PF) 0.5 MG/ML IJ SOLN
INTRAMUSCULAR | Status: DC | PRN
  Administered 2013-04-07: 4 mg via EPIDURAL

## 2013-04-07 MED ORDER — IBUPROFEN 600 MG PO TABS
600.0000 mg | ORAL_TABLET | Freq: Four times a day (QID) | ORAL | Status: DC
  Administered 2013-04-08 – 2013-04-09 (×6): 600 mg via ORAL
  Filled 2013-04-07 (×6): qty 1

## 2013-04-07 MED ORDER — TETANUS-DIPHTH-ACELL PERTUSSIS 5-2.5-18.5 LF-MCG/0.5 IM SUSP
0.5000 mL | Freq: Once | INTRAMUSCULAR | Status: AC
  Administered 2013-04-08: 0.5 mL via INTRAMUSCULAR

## 2013-04-07 MED ORDER — MORPHINE SULFATE 0.5 MG/ML IJ SOLN
INTRAMUSCULAR | Status: AC
  Filled 2013-04-07: qty 10

## 2013-04-07 MED ORDER — OXYTOCIN 10 UNIT/ML IJ SOLN
INTRAMUSCULAR | Status: AC
  Filled 2013-04-07: qty 4

## 2013-04-07 MED ORDER — ONDANSETRON HCL 4 MG/2ML IJ SOLN
INTRAMUSCULAR | Status: AC
  Filled 2013-04-07: qty 2

## 2013-04-07 MED ORDER — MORPHINE SULFATE 4 MG/ML IJ SOLN
2.0000 mg | INTRAMUSCULAR | Status: DC | PRN
Start: 2013-04-07 — End: 2013-04-09

## 2013-04-07 MED ORDER — LACTATED RINGERS IV SOLN
INTRAVENOUS | Status: DC
  Administered 2013-04-07: 21:00:00 via INTRAVENOUS

## 2013-04-07 MED ORDER — ONDANSETRON HCL 4 MG/2ML IJ SOLN: 4.0000 mg | Freq: Three times a day (TID) | INTRAMUSCULAR | Status: DC | PRN

## 2013-04-07 MED ORDER — WITCH HAZEL-GLYCERIN EX PADS: 1.0000 "application " | MEDICATED_PAD | CUTANEOUS | Status: DC | PRN

## 2013-04-07 MED ORDER — SODIUM CHLORIDE 0.9 % IJ SOLN: 3.0000 mL | INTRAMUSCULAR | Status: DC | PRN

## 2013-04-07 MED ORDER — SODIUM BICARBONATE 8.4 % IV SOLN
INTRAVENOUS | Status: DC | PRN
  Administered 2013-04-07: 30 mL via EPIDURAL

## 2013-04-07 MED ORDER — PROMETHAZINE HCL 25 MG/ML IJ SOLN
12.5000 mg | Freq: Four times a day (QID) | INTRAMUSCULAR | Status: DC | PRN
  Administered 2013-04-07: 12.5 mg via INTRAVENOUS
  Filled 2013-04-07: qty 1

## 2013-04-07 MED ORDER — LACTATED RINGERS IV SOLN
INTRAVENOUS | Status: DC | PRN
  Administered 2013-04-07 (×4): via INTRAVENOUS

## 2013-04-07 SURGICAL SUPPLY — 40 items
BENZOIN TINCTURE PRP APPL 2/3 (GAUZE/BANDAGES/DRESSINGS) ×2 IMPLANT
BINDER ABD UNIV 10 28-50 (GAUZE/BANDAGES/DRESSINGS) ×1 IMPLANT
BINDER ABD UNIV 12 45-62 (WOUND CARE) IMPLANT
BINDER ABDOM UNIV 10 (GAUZE/BANDAGES/DRESSINGS) ×2
BINDER ABDOMINAL 46IN 62IN (WOUND CARE)
CLOTH BEACON ORANGE TIMEOUT ST (SAFETY) ×2 IMPLANT
DRAPE LG THREE QUARTER DISP (DRAPES) ×2 IMPLANT
DRSG OPSITE 11X17.75 LRG (GAUZE/BANDAGES/DRESSINGS) ×2 IMPLANT
DRSG OPSITE POSTOP 4X10 (GAUZE/BANDAGES/DRESSINGS) ×2 IMPLANT
DURAPREP 26ML APPLICATOR (WOUND CARE) ×2 IMPLANT
ELECT REM PT RETURN 9FT ADLT (ELECTROSURGICAL) ×2
ELECTRODE REM PT RTRN 9FT ADLT (ELECTROSURGICAL) ×1 IMPLANT
EXTRACTOR VACUUM M CUP 4 TUBE (SUCTIONS) IMPLANT
GLOVE BIO SURGEON STRL SZ7 (GLOVE) ×2 IMPLANT
GLOVE BIOGEL PI IND STRL 7.0 (GLOVE) ×3 IMPLANT
GLOVE BIOGEL PI INDICATOR 7.0 (GLOVE) ×3
GOWN STRL REIN XL XLG (GOWN DISPOSABLE) ×4 IMPLANT
KIT ABG SYR 3ML LUER SLIP (SYRINGE) IMPLANT
NEEDLE HYPO 22GX1.5 SAFETY (NEEDLE) ×2 IMPLANT
NEEDLE HYPO 25X5/8 SAFETYGLIDE (NEEDLE) IMPLANT
NS IRRIG 1000ML POUR BTL (IV SOLUTION) ×2 IMPLANT
PACK C SECTION WH (CUSTOM PROCEDURE TRAY) ×2 IMPLANT
PAD ABD 7.5X8 STRL (GAUZE/BANDAGES/DRESSINGS) ×2 IMPLANT
PAD OB MATERNITY 4.3X12.25 (PERSONAL CARE ITEMS) ×2 IMPLANT
RTRCTR C-SECT PINK 25CM LRG (MISCELLANEOUS) IMPLANT
SPONGE SURGIFOAM ABS GEL 12-7 (HEMOSTASIS) IMPLANT
STAPLER VISISTAT 35W (STAPLE) IMPLANT
STRIP CLOSURE SKIN 1/2X4 (GAUZE/BANDAGES/DRESSINGS) ×2 IMPLANT
SUT PDS AB 0 CTX 60 (SUTURE) IMPLANT
SUT PLAIN 0 NONE (SUTURE) IMPLANT
SUT SILK 0 TIES 10X30 (SUTURE) IMPLANT
SUT VIC AB 0 CT1 36 (SUTURE) ×6 IMPLANT
SUT VIC AB 3-0 CT1 27 (SUTURE)
SUT VIC AB 3-0 CT1 TAPERPNT 27 (SUTURE) IMPLANT
SUT VIC AB 4-0 KS 27 (SUTURE) ×2 IMPLANT
SYR CONTROL 10ML LL (SYRINGE) ×2 IMPLANT
TAPE CLOTH SURG 4X10 WHT LF (GAUZE/BANDAGES/DRESSINGS) ×2 IMPLANT
TOWEL OR 17X24 6PK STRL BLUE (TOWEL DISPOSABLE) ×4 IMPLANT
TRAY FOLEY CATH 14FR (SET/KITS/TRAYS/PACK) IMPLANT
WATER STERILE IRR 1000ML POUR (IV SOLUTION) ×2 IMPLANT

## 2013-04-07 NOTE — Progress Notes (Signed)
Alison Archer is a 22 y.o. G1P0000 at [redacted]w[redacted]d admitted for induction of labor due to prom.  Subjective: Comfortable w/ epidural, just received phenergan for nausea. Denies ha, scotomata, ruq/epigastric pain.    Objective: BP 141/94  Pulse 83  Temp(Src) 98.5 F (36.9 C) (Oral)  Resp 18  Ht 5\' 5"  (1.651 m)  Wt 104.327 kg (230 lb)  BMI 38.27 kg/m2  LMP 06/27/2012  Breastfeeding? Unknown I/O last 3 completed shifts: In: -  Out: 1600 [Urine:1600] Total I/O In: -  Out: 3150 [Urine:2600; Emesis/NG output:550] BP range: 122-165/69-104, most 140s/90s  FHT:  FHR: 125 bpm, variability: moderate,  accelerations:  Present,  decelerations:  Present variables w/ sve, resolved w/ maternal position change UC:   irregular, every 26 minutes SVE:   Dilation: 5.5 Effacement (%): 90 Station: -2;-1 Exam by:: Genella Rife CNM w/ caput DTRs 1+, BLE 2+, no clonus  Labs: Lab Results  Component Value Date   WBC 9.2 04/06/2013   HGB 11.2* 04/06/2013   HCT 34.6* 04/06/2013   MCV 87.8 04/06/2013   PLT 271 04/06/2013    Assessment / Plan: IOL d/t prolonged prom since 5/13 @ 0300, s/p foley bulb- was having spontaneous uc's that are now irregular and no cervical change. Will begin pitocin per protol.   Elevated BPs w/ normal labs and no s/s pre-e, Labetalol IV per protocol if needed   Labor: n/a Preeclampsia:  no s/s, labs normal Fetal Wellbeing:  Category II Pain Control:  Epidural I/D:  PCN per protocol for gbs pos Anticipated MOD:  NSVD  Marge Duncans 04/07/2013, 11:23 AM

## 2013-04-07 NOTE — Op Note (Signed)
Alison Archer PROCEDURE DATE: 04/05/2013 - 04/07/2013  PREOPERATIVE DIAGNOSIS: Intrauterine pregnancy at  [redacted]w[redacted]d weeks gestation; non-reassuring fetal status; prolonged rupture of membranes  POSTOPERATIVE DIAGNOSIS: The same  PROCEDURE: Primary/Repeat Low Transverse Cesarean Section  SURGEON:  Dr. Eber Jones L. Harraway-Smith  ASSISTANT:  none   INDICATIONS: Alison Archer is a 22 y.o. G2P1001 at [redacted]w[redacted]d here for cesarean section secondary to the indications listed under preoperative diagnosis; please see preoperative note for further details.  The risks of cesarean section were discussed with the patient including but were not limited to: bleeding which may require transfusion or reoperation; infection which may require antibiotics; injury to bowel, bladder, ureters or other surrounding organs; injury to the fetus; need for additional procedures including hysterectomy in the event of a life-threatening hemorrhage; placental abnormalities wth subsequent pregnancies, incisional problems, thromboembolic phenomenon and other postoperative/anesthesia complications.   The patient concurred with the proposed plan, giving informed written consent for the procedure.    FINDINGS:  Viable female infant in cephalic presentation. Occiput posterior position. Nuchal cord x 1.  Apgars 4 and 7. PH 7.18 Clear amniotic fluid.  Intact placenta, three vessel cord.  Normal uterus, fallopian tubes and ovaries bilaterally.  ANESTHESIA: Epidural INTRAVENOUS FLUIDS: 1000 ml ESTIMATED BLOOD LOSS:800 ml URINE OUTPUT:  100 ml SPECIMENS: Placenta sent to pathology/L&D COMPLICATIONS: None immediate  PROCEDURE IN DETAIL:  The patient preoperatively received intravenous antibiotics and had sequential compression devices applied to her lower extremities.  She was then taken to the operating room where spinal anesthesia was administered and was found to be adequate. She was then placed in a dorsal supine position with a leftward  tilt, and prepped and draped in a sterile manner.  A foley catheter was placed into her bladder and attached to constant gravity.  After an adequate timeout was performed, a Pfannenstiel skin incision was made with scalpel and carried through to the underlying layer of fascia. The fascia was incised in the midline, and this incision was extended bilaterally using the Mayo scissors.  Kocher clamps were applied to the superior aspect of the fascial incision and the underlying rectus muscles were dissected off bluntly. A similar process was carried out on the inferior aspect of the fascial incision. The rectus muscles were separated in the midline bluntly and the peritoneum was entered bluntly. Attention was turned to the lower uterine segment where a low transverse hysterotomy incision was made with a scalpel and extended bilaterally bluntly.  The infant was successfully delivered, the cord was clamped and cut and the infant was handed over to awaiting neonatology team. Uterine massage was then administered, and the placenta delivered intact with a three-vessel cord. The uterus was then cleared of clot and debris.  The hysterotomy was closed with 0 Vicryl in a running locked fashion, and an imbricating layer was also placed with the same suture. The uterus was returned to the pelvis. The pelvis was cleared of all clot and debris. Hemostasis was confirmed on all surfaces.  The peritoneum and the muscles were reapproximated using 0Vicryl in 1 interrupted suture. The fascia was then closed using 0 Vicryl in a running fashion.  The subcutaneous layer was irrigated, then reapproximated with 3-0 vicryl in a running locked fashion, and the skin was closed with a 4-0 Vicryl subcuticular stitch. The incision was injected with 30cc of .25% Marcian  The patient tolerated the procedure well. Sponge, lap, instrument and needle counts were correct x 2.  She was taken to the recovery room  in stable condition.

## 2013-04-07 NOTE — Brief Op Note (Signed)
04/05/2013 - 04/07/2013  3:21 PM  PATIENT:  Alison Archer  22 y.o. female  PRE-OPERATIVE DIAGNOSIS:  non-reassuring fetal heart tracing  POST-OPERATIVE DIAGNOSIS:  non-reassuring fetal heart tracing  PROCEDURE:  Procedure(s): Primary cesarean section with delivery of baby boy at 57.  Apgars 4/7. (N/A)  SURGEON:  Surgeon(s) and Role:    * Willodean Rosenthal, MD - Primary  ANESTHESIA:   epidural  EBL:  Total I/O In: 1000 [I.V.:1000] Out: 4150 [Urine:3600; Emesis/NG output:550]  BLOOD ADMINISTERED:none  DRAINS: none   LOCAL MEDICATIONS USED:  MARCAINE     SPECIMEN:  Source of Specimen:  placenta  DISPOSITION OF SPECIMEN:  PATHOLOGY  COUNTS:  YES  TOURNIQUET:  * No tourniquets in log *  DICTATION: .Note written in EPIC  PLAN OF CARE: Admit to inpatient   PATIENT DISPOSITION:  PACU - hemodynamically stable.   Delay start of Pharmacological VTE agent (>24hrs) due to surgical blood loss or risk of bleeding: yes

## 2013-04-07 NOTE — Anesthesia Postprocedure Evaluation (Signed)
Anesthesia Post Note  Patient: Alison Archer  Procedure(s) Performed: Procedure(s) (LRB): Primary cesarean section with delivery of baby boy at 31.  Apgars 4/7. (N/A)  Anesthesia type: Epidural  Patient location: PACU  Post pain: Pain level controlled  Post assessment: Post-op Vital signs reviewed  Last Vitals: BP 146/90  Pulse 97  Temp(Src) 37.3 C (Oral)  Resp 20  Ht 5\' 5"  (1.651 m)  Wt 230 lb (104.327 kg)  BMI 38.27 kg/m2  SpO2 100%  LMP 06/27/2012  Breastfeeding? Unknown  Post vital signs: Reviewed  Level of consciousness: sedated  Complications: No apparent anesthesia complications

## 2013-04-07 NOTE — Progress Notes (Signed)
Patient ID: Alison Archer, female   DOB: 14-Sep-1991, 22 y.o.   MRN: [id number] Pt is a 21yo G1P0 at 40= weeks who was admitted for PROM that occurred >3days prev.  She had a Cat ! FHR until ~15-20 min prev when she developed deep repetitive variable decelerations which did not respond to conservative measures including Amnioinfusion.  Pt cervix was 6-7/90% with -3 station and lots of molding and caput.    D/w pt and partner plan to proceed to operative delivery.  She agrees.  All of her questions were answered.  Earnie Bechard L. Harraway-Smith, M.D., [facility]

## 2013-04-07 NOTE — Progress Notes (Addendum)
Alison Archer is a 22 y.o. G1P0000 at [redacted]w[redacted]d admitted for prolonged prom Called to room by RN at 1350 for recurrent deep variables  Subjective: Mostly comfortable w/ epidural, feeling some pain in buttocks  Objective: BP 139/92  Pulse 94  Temp(Src) 99 F (37.2 C) (Oral)  Resp 18  Ht 5\' 5"  (1.651 m)  Wt 104.327 kg (230 lb)  BMI 38.27 kg/m2  LMP 06/27/2012  Breastfeeding? Unknown I/O last 3 completed shifts: In: -  Out: 1600 [Urine:1600] Total I/O In: -  Out: 4150 [Urine:3600; Emesis/NG output:550]  FHT:  FHR: 170 bpm, variability: moderate,  accelerations:  Present,  decelerations:  Present recurrent deep variables UC:   regular, every 2-3 minutes SVE:   6.5/90/-1 with caput and molding  Pitocin turned off before I entered room IUPC placed and amnioinfusion bolus initiated Multiple maternal position changes, O2 @ 10l/min via nrbm, IV LR bolus w/o improvement of recurrent deep variables- Dr. Erin Fulling called to room to evaluate strip  Labs: Lab Results  Component Value Date   WBC 9.2 04/06/2013   HGB 11.2* 04/06/2013   HCT 34.6* 04/06/2013   MCV 87.8 04/06/2013   PLT 271 04/06/2013    Assessment / Plan: IOL d/t prolonged prom since 5/13 @ 0300, recurrent deep variables- now has amnioinfusion, no improvement of decels w/ interventions, decision for PLTCS by Dr. Erin Fulling- please refer to her notes for detail  Labor: progressing slowly Preeclampsia:  no s/s, labs normal, some elevated bp's Fetal Wellbeing:  Category II Pain Control:  Epidural I/D:  PCN for GBS pos Anticipated MOD:  PLTCS for NRFHR  Marge Duncans 04/07/2013, 2:21 PM

## 2013-04-07 NOTE — OR Nursing (Addendum)
Foley catheter in upon arrival to OR. Urine color-yellow. Uterus massaged by S. Boruch Manuele Charity fundraiser. Two tubes of cord blood sent to lab.  25 cc of blood evacuated from uterus during uterine massage.

## 2013-04-07 NOTE — OR Nursing (Signed)
Patient sent for at 1422.

## 2013-04-07 NOTE — Progress Notes (Signed)
A bolus of normal saline 1000cc was administered at 900cc/hr at 2245. The bag would not scan even though the pharmacist had verified it.

## 2013-04-07 NOTE — Transfer of Care (Signed)
Immediate Anesthesia Transfer of Care Note  Patient: Alison Archer  Procedure(s) Performed: Procedure(s): Primary cesarean section with delivery of baby boy at 43.  Apgars 4/7. (N/A)  Patient Location: PACU  Anesthesia Type:Epidural  Level of Consciousness: awake, alert , oriented and patient cooperative  Airway & Oxygen Therapy: Patient Spontanous Breathing  Post-op Assessment: Report given to PACU RN and Post -op Vital signs reviewed and stable  Post vital signs: Reviewed and stable  Complications: No apparent anesthesia complications

## 2013-04-08 LAB — CBC
MCHC: 33.1 g/dL (ref 30.0–36.0)
Platelets: 255 10*3/uL (ref 150–400)
RDW: 13.4 % (ref 11.5–15.5)
WBC: 13 10*3/uL — ABNORMAL HIGH (ref 4.0–10.5)

## 2013-04-08 NOTE — Anesthesia Postprocedure Evaluation (Signed)
  Anesthesia Post-op Note  Patient: Alison Archer  Procedure(s) Performed: Procedure(s): Primary cesarean section with delivery of baby boy at 37.  Apgars 4/7. (N/A)  Patient Location: Mother/Baby  Anesthesia Type:Epidural  Level of Consciousness: awake, alert  and oriented  Airway and Oxygen Therapy: Patient Spontanous Breathing  Post-op Pain: mild  Post-op Assessment: Patient's Cardiovascular Status Stable, Respiratory Function Stable, Patent Airway, No signs of Nausea or vomiting, Pain level controlled, No headache, No backache, No residual numbness and No residual motor weakness  Post-op Vital Signs: stable  Complications: No apparent anesthesia complications

## 2013-04-08 NOTE — Progress Notes (Signed)
Subjective: Postpartum Day 1: Cesarean Delivery Patient reports incisional pain, tolerating PO and no problems voiding.    Objective: Vital signs in last 24 hours: Temp:  [97.7 F (36.5 C)-99.7 F (37.6 C)] 99.2 F (37.3 C) (05/18 0750) Pulse Rate:  [78-116] 89 (05/18 0750) Resp:  [18-20] 20 (05/18 0750) BP: (101-163)/(64-95) 131/84 mmHg (05/18 0750) SpO2:  [95 %-100 %] 99 % (05/18 0750)  Physical Exam:  General: alert, cooperative and no distress Lochia: appropriate Uterine Fundus: firm Incision: dressing dry DVT Evaluation: No evidence of DVT seen on physical exam.   Recent Labs  04/06/13 1904 04/08/13 0610  HGB 11.2* 8.7*  HCT 34.6* 26.3*    Assessment/Plan: Status post Cesarean section. Doing well postoperatively.  Continue current care.  EURE,LUTHER H 04/08/2013, 8:58 AM

## 2013-04-09 ENCOUNTER — Encounter (HOSPITAL_COMMUNITY): Payer: Self-pay | Admitting: Obstetrics & Gynecology

## 2013-04-09 MED ORDER — IBUPROFEN 600 MG PO TABS: 600.0000 mg | ORAL_TABLET | Freq: Four times a day (QID) | ORAL | Status: DC

## 2013-04-09 MED ORDER — OXYCODONE-ACETAMINOPHEN 5-325 MG PO TABS
1.0000 | ORAL_TABLET | ORAL | Status: DC | PRN
Start: 2013-04-09 — End: 2014-03-07

## 2013-04-09 NOTE — Clinical Social Work Maternal (Signed)
    Clinical Social Work Department PSYCHOSOCIAL ASSESSMENT - MATERNAL/CHILD 04/09/2013  Patient:  Alison Archer, Alison Archer  Account Number:  [id number]  Admit Date:  04/05/2013  Alison Archer Name:   Alison Archer    Clinical Social Worker:  Alison Putnam, LCSW   Date/Time:  04/09/2013 03:51 PM  Date Referred:  04/09/2013   Referral source  CN     Referred reason  Substance Abuse   Other referral source:    I:  FAMILY / HOME ENVIRONMENT Child's legal guardian:  PARENT  Guardian - Name Guardian - Age Guardian - Address  [place] [address] [place], [place] [address]  Maxie Better 25 (same as above)   Other household support members/support persons Name Relationship DOB  Wendelyn Breslow ROOMMATE    Other support:   Family    II  PSYCHOSOCIAL DATA Information Source:  Patient Interview  Event organiser Employment:   Surveyor, quantity resources:  [employer] If Medicaid - County:  [place] Other  Sales executive  WIC   School / Grade:   Maternity Care Coordinator / Child Services Coordination / Early Interventions:  Cultural issues impacting care:    III  STRENGTHS Strengths  Adequate Resources  Home prepared for Child (including basic supplies)  Supportive family/friends   Strength comment:    IV  RISK FACTORS AND CURRENT PROBLEMS Current Problem:  YES   Risk Factor & Current Problem Patient Issue Family Issue Risk Factor / Current Problem Comment  Substance Abuse Y N Hx of MJ use    V  SOCIAL WORK ASSESSMENT CSW referral received to assess pt's history of MJ use.  Pt admits to smoking MJ "almost daily," prior to pregnancy at 6 weeks.  Once pregnancy was confirmed she continued to smoke "maybe once or twice," a week to help with appetite. Pt told CSW that she eventually stopped smoking but could recall the date.  She denies other illegal substance use & verbalized understanding of hospital drug testing policy. UDS collection pending, as well as meconium  results.  She has all the necessary supplies for the infant & good support from both sides of their families.  Pt appears to be appropriate & bonding well.  CSW will monitor drug screen results & make a referral if necessary.      VI SOCIAL WORK PLAN Social Work Plan  No Further Intervention Required / No Barriers to Discharge   Type of pt/family education:   If child protective services report - county:   If child protective services report - date:   Information/referral to community resources comment:   Other social work plan:

## 2013-04-09 NOTE — Progress Notes (Signed)
Ur chart review completed.  

## 2013-04-09 NOTE — Discharge Summary (Signed)
Obstetric Discharge Summary Reason for Admission: rupture of membranes Prenatal Procedures: none Intrapartum Procedures: cesarean: low cervical, transverse Postpartum Procedures: none Complications-Operative and Postpartum: none Hemoglobin  Date Value Range Status  04/08/2013 8.7* 12.0 - 15.0 g/dL Final     DELTA CHECK NOTED     REPEATED TO VERIFY     HCT  Date Value Range Status  04/08/2013 26.3* 36.0 - 46.0 % Final  Pt  Arrived @ MAU on the 15th with c/o ROM. Was confirmed, pt not contracing @ that time. Admitted to L&D, cytotec given for cervical ripening. Foley bulb placed for mechanical dilation, then pitocin started. During course of labor pt experienced elevated blood pressures and non reassuring FHRs. She was induced over a period of two days and was sectioned for failed induction of labor and non reassuring FHR. Her postpartum course has been normal. Pt had not planned on BF but now desires to do so for a little while. Will have lactation consultants assess before discharge.  Physical Exam:  General: alert, cooperative, appears stated age, no distress and moderately obese Lochia: appropriate Uterine Fundus: firm Incision: no significant drainage DVT Evaluation: Negative Homan's sign. Calf/Ankle edema is present.  Discharge Diagnoses: Failed induction and PROM x48 hours  Discharge Information: Date: 04/09/2013 Activity: pelvic rest Diet: routine Medications: PNV, Ibuprofen and Percocet Condition: stable Instructions: refer to practice specific booklet Discharge to: home Follow-up Information   Follow up with [facility] [place]. Schedule an appointment as soon as possible for a visit in 6 weeks.   Contact information:   [address] [place] [address] [phone]      Newborn Data: Live born female  Birth Weight: 6 lb 4.4 oz (2845 g) APGAR: 4, 7  Home with mother. Discussed with pt the advantages of breastfeeding and encouraged to stick with it for  at least 6 weeks. Will use POP, discussed risk and benefits of this.  Chancy, Gwendolyn A 04/09/2013, 7:46 AM  I have seen and examined this patient and I agree with the above. [place], Alizza Sacra 9:04 AM 04/09/2013

## 2013-04-12 ENCOUNTER — Inpatient Hospital Stay (HOSPITAL_COMMUNITY): Admission: RE | Admit: 2013-04-12 | Payer: Medicaid Other | Source: Ambulatory Visit

## 2014-03-07 ENCOUNTER — Inpatient Hospital Stay (HOSPITAL_COMMUNITY)
Admission: AD | Admit: 2014-03-07 | Discharge: 2014-03-07 | Disposition: A | Discharge status: Home / Self Care | Payer: Medicaid Other | Source: Ambulatory Visit | Attending: Obstetrics & Gynecology | Admitting: Obstetrics & Gynecology

## 2014-03-07 ENCOUNTER — Encounter (HOSPITAL_COMMUNITY): Payer: Self-pay | Admitting: *Deleted

## 2014-03-07 DIAGNOSIS — R109 Unspecified abdominal pain: Secondary | ICD-10-CM | POA: Insufficient documentation

## 2014-03-07 DIAGNOSIS — K219 Gastro-esophageal reflux disease without esophagitis: Secondary | ICD-10-CM

## 2014-03-07 DIAGNOSIS — F17200 Nicotine dependence, unspecified, uncomplicated: Secondary | ICD-10-CM | POA: Insufficient documentation

## 2014-03-07 DIAGNOSIS — R12 Heartburn: Secondary | ICD-10-CM | POA: Insufficient documentation

## 2014-03-07 LAB — POCT PREGNANCY, URINE: Preg Test, Ur: NEGATIVE

## 2014-03-07 MED ORDER — GI COCKTAIL ~~LOC~~
30.0000 mL | ORAL | Status: AC
  Administered 2014-03-07: 30 mL via ORAL
  Filled 2014-03-07: qty 30

## 2014-03-07 NOTE — MAU Provider Note (Signed)
Chief Complaint: Abdominal Pain and Heartburn   None    SUBJECTIVE HPI: Alison Archer is a 23 y.o. G1P1001 who presents to maternity admissions reporting heartburn x2 weeks and intermittent mild abdominal cramping.  She reports she has not had heartburn since her last pregnancy and was worried she was pregnant.  Patient's last menstrual period was 02/27/2014.  She denies vaginal bleeding, vaginal itching/burning, urinary symptoms, h/a, dizziness, n/v, or fever/chills.   Past Medical History  Diagnosis Date  . Asthma     exercise induced  . Seizures     febrile as a child   Past Surgical History  Procedure Laterality Date  . No past surgeries    . Cesarean section N/A 04/07/2013    Procedure: Primary cesarean section with delivery of baby boy at 64.  Apgars 4/7.;  Surgeon: Lavonia Drafts, MD;  Location: [facility];  Service: Obstetrics;  Laterality: N/A;   History   Social History  . Marital Status: Single    Spouse Name: N/A    Number of Children: N/A  . Years of Education: N/A   Occupational History  . Not on file.   Social History Main Topics  . Smoking status: Current Every Day Smoker -- 0.25 packs/day    Types: Cigarettes  . Smokeless tobacco: Not on file  . Alcohol Use: No  . Drug Use: Yes    Special: Marijuana     Comment: not used recently  . Sexual Activity: Yes   Other Topics Concern  . Not on file   Social History Narrative  . No narrative on file   No current facility-administered medications on file prior to encounter.   No current outpatient prescriptions on file prior to encounter.   No Known Allergies  ROS: Pertinent items in HPI  OBJECTIVE Blood pressure 127/80, pulse 95, temperature 97.5 F (36.4 C), temperature source Oral, resp. rate 18, height 5\' 5"  (1.651 m), weight 92.534 kg (204 lb), last menstrual period 02/27/2014, unknown if currently breastfeeding. GENERAL: Well-developed, well-nourished female in no acute distress.  HEENT:  Normocephalic HEART: normal rate, rhythm, heart sounds RESP: normal effort, lung sounds clear and equal bilaterally ABDOMEN: Soft, non-tender EXTREMITIES: Nontender, no edema NEURO: Alert and oriented SPECULUM EXAM: Declined by pt  LAB RESULTS Results for orders placed during the hospital encounter of 03/07/14 (from the past 24 hour(s))  POCT PREGNANCY, URINE     Status: None   Collection Time    03/07/14  9:45 AM      Result Value Ref Range   Preg Test, Ur NEGATIVE  NEGATIVE    ASSESSMENT 1. Acid reflux     PLAN GI Cocktail in MAU with resolution of symptoms Discharge home Zantac 150 mg OTC BID for heartburn Diet/teaching for acid reflux provided Return to MAU as needed for emergencies    Medication List         albuterol 108 (90 BASE) MCG/ACT inhaler  Commonly known as:  PROVENTIL HFA;VENTOLIN HFA  Inhale 1 puff into the lungs every 6 (six) hours as needed for wheezing or shortness of breath.           Follow-up Information   Follow up with [facility]. (As needed, If symptoms worsen)    Contact information:   [address] [id number] [place] [address] [phone]      Fatima Blank Certified Nurse-Midwife 03/07/2014  10:49 AM

## 2014-03-07 NOTE — MAU Note (Signed)
Confused, just had a period. Is having hearburn and pain, never had heartburn except when preg with first child.

## 2014-03-07 NOTE — MAU Provider Note (Signed)

## 2014-03-07 NOTE — Discharge Instructions (Signed)
Take Zantac (ranitidine) 150 mg twice per day for heartburn.     Diet for Gastroesophageal Reflux Disease, Adult Reflux (acid reflux) is when acid from your stomach flows up into the esophagus. When acid comes in contact with the esophagus, the acid causes irritation and soreness (inflammation) in the esophagus. When reflux happens often or so severely that it causes damage to the esophagus, it is called gastroesophageal reflux disease (GERD). Nutrition therapy can help ease the discomfort of GERD. FOODS OR DRINKS TO AVOID OR LIMIT  Smoking or chewing tobacco. Nicotine is one of the most potent stimulants to acid production in the gastrointestinal tract.  Caffeinated and decaffeinated coffee and black tea.  Regular or low-calorie carbonated beverages or energy drinks (caffeine-free carbonated beverages are allowed).   Strong spices, such as black pepper, white pepper, red pepper, cayenne, curry powder, and chili powder.  Peppermint or spearmint.  Chocolate.  High-fat foods, including meats and fried foods. Extra added fats including oils, butter, salad dressings, and nuts. Limit these to less than 8 tsp per day.  Fruits and vegetables if they are not tolerated, such as citrus fruits or tomatoes.  Alcohol.  Any food that seems to aggravate your condition. If you have questions regarding your diet, call your caregiver or a registered dietitian. OTHER THINGS THAT MAY HELP GERD INCLUDE:   Eating your meals slowly, in a relaxed setting.  Eating 5 to 6 small meals per day instead of 3 large meals.  Eliminating food for a period of time if it causes distress.  Not lying down until 3 hours after eating a meal.  Keeping the head of your bed raised 6 to 9 inches (15 to 23 cm) by using a foam wedge or blocks under the legs of the bed. Lying flat may make symptoms worse.  Being physically active. Weight loss may be helpful in reducing reflux in overweight or obese adults.  Wear loose  fitting clothing EXAMPLE MEAL PLAN This meal plan is approximately 2,000 calories based on [email or web address] meal planning guidelines. Breakfast   cup cooked oatmeal.  1 cup strawberries.  1 cup low-fat milk.  1 oz almonds. Snack  1 cup cucumber slices.  6 oz yogurt (made from low-fat or fat-free milk). Lunch  2 slice whole-wheat bread.  2 oz sliced [place].  2 tsp mayonnaise.  1 cup blueberries.  1 cup snap peas. Snack  6 whole-wheat crackers.  1 oz string cheese. Dinner   cup brown rice.  1 cup mixed veggies.  1 tsp olive oil.  3 oz grilled fish. Document Released: 11/08/2005 Document Revised: 01/31/2012 Document Reviewed: 09/24/2011 [facility] Patient Information 2014 [place], [place].

## 2014-09-23 ENCOUNTER — Encounter (HOSPITAL_COMMUNITY): Payer: Self-pay | Admitting: *Deleted

## 2015-01-17 ENCOUNTER — Emergency Department (HOSPITAL_COMMUNITY)
Admission: EM | Admit: 2015-01-17 | Discharge: 2015-01-17 | Disposition: A | Discharge status: Home / Self Care | Payer: Self-pay | Attending: Emergency Medicine | Admitting: Emergency Medicine

## 2015-01-17 ENCOUNTER — Encounter (HOSPITAL_COMMUNITY): Payer: Self-pay | Admitting: Nurse Practitioner

## 2015-01-17 DIAGNOSIS — B9689 Other specified bacterial agents as the cause of diseases classified elsewhere: Secondary | ICD-10-CM

## 2015-01-17 DIAGNOSIS — N760 Acute vaginitis: Secondary | ICD-10-CM | POA: Insufficient documentation

## 2015-01-17 DIAGNOSIS — Z3202 Encounter for pregnancy test, result negative: Secondary | ICD-10-CM | POA: Insufficient documentation

## 2015-01-17 DIAGNOSIS — Z720 Tobacco use: Secondary | ICD-10-CM | POA: Insufficient documentation

## 2015-01-17 DIAGNOSIS — J45909 Unspecified asthma, uncomplicated: Secondary | ICD-10-CM | POA: Insufficient documentation

## 2015-01-17 LAB — URINALYSIS, ROUTINE W REFLEX MICROSCOPIC
BILIRUBIN URINE: NEGATIVE
Glucose, UA: NEGATIVE mg/dL
HGB URINE DIPSTICK: NEGATIVE
Ketones, ur: 15 mg/dL — AB
Leukocytes, UA: NEGATIVE
Nitrite: NEGATIVE
PROTEIN: NEGATIVE mg/dL
SPECIFIC GRAVITY, URINE: 1.034 — AB (ref 1.005–1.030)
UROBILINOGEN UA: 1 mg/dL (ref 0.0–1.0)
pH: 6 (ref 5.0–8.0)

## 2015-01-17 LAB — WET PREP, GENITAL
TRICH WET PREP: NONE SEEN
Yeast Wet Prep HPF POC: NONE SEEN

## 2015-01-17 LAB — POC URINE PREG, ED: PREG TEST UR: NEGATIVE

## 2015-01-17 MED ORDER — METRONIDAZOLE 500 MG PO TABS: 500.0000 mg | ORAL_TABLET | Freq: Two times a day (BID) | ORAL | Status: DC

## 2015-01-17 NOTE — ED Provider Notes (Signed)
CSN: [id number]     Arrival date & time 01/17/15  5053 History   First MD Initiated Contact with Patient 01/17/15 [phone]     Chief Complaint  Patient presents with  . Vaginal Discharge     (Consider location/radiation/quality/duration/timing/severity/associated sxs/prior Treatment) HPI Comments: Patient presents today with a chief complaint of vaginal discharge.  She reports that the discharge has been present for the past 2 days.  She states that the discharge is brownish in color and has a fishy odor.  She states that she is sexually active and has unprotected sex with one partner.  No prior history of STD's.  No fever, chills, nausea, vomiting, urinary symptoms, abdominal pain, or pelvic pain.    Patient is a 24 y.o. female presenting with vaginal discharge. The history is provided by the patient.  Vaginal Discharge   Past Medical History  Diagnosis Date  . Asthma     exercise induced  . Seizures     febrile as a child   Past Surgical History  Procedure Laterality Date  . No past surgeries    . Cesarean section N/A 04/07/2013    Procedure: Primary cesarean section with delivery of baby boy at 33.  Apgars 4/7.;  Surgeon: Lavonia Drafts, MD;  Location: [facility];  Service: Obstetrics;  Laterality: N/A;   Family History  Problem Relation Age of Onset  . Hypertension Mother   . Hypertension Father   . Asthma Sister   . Hypertension Sister   . Diabetes Maternal Grandmother    History  Substance Use Topics  . Smoking status: Current Every Day Smoker -- 0.25 packs/day    Types: Cigarettes  . Smokeless tobacco: Not on file  . Alcohol Use: No   OB History    Gravida Para Term Preterm AB TAB SAB Ectopic Multiple Living   1 1 1  0 0 0 0 0 0 1     Review of Systems  Genitourinary: Positive for vaginal discharge.  All other systems reviewed and are negative.     Allergies  Review of patient's allergies indicates no known allergies.  Home Medications   Prior to  Admission medications   Medication Sig Start Date End Date Taking? Authorizing Provider  albuterol (PROVENTIL HFA;VENTOLIN HFA) 108 (90 BASE) MCG/ACT inhaler Inhale 1 puff into the lungs every 6 (six) hours as needed for wheezing or shortness of breath.   Yes Historical Provider, MD  Tetrahydrozoline HCl (VISINE OP) Apply 1 drop to eye as needed (discoloration).   Yes Historical Provider, MD   BP 125/71 mmHg  Pulse 96  Temp(Src) 98.8 F (37.1 C) (Oral)  Resp 16  Ht 5' 5.5" (1.664 m)  Wt 196 lb (88.905 kg)  BMI 32.11 kg/m2  SpO2 100%  LMP 01/10/2015 Physical Exam  Constitutional: She appears well-developed and well-nourished.  HENT:  Head: Normocephalic and atraumatic.  Mouth/Throat: Oropharynx is clear and moist.  Neck: Normal range of motion. Neck supple.  Cardiovascular: Normal rate, regular rhythm and normal heart sounds.   Pulmonary/Chest: Effort normal and breath sounds normal.  Abdominal: Soft. Bowel sounds are normal. She exhibits no distension and no mass. There is no tenderness. There is no rebound and no guarding.  Genitourinary: Cervix exhibits no motion tenderness. Right adnexum displays no mass, no tenderness and no fullness. Left adnexum displays no mass, no tenderness and no fullness.  Whitish colored discharge in the vaginal vault  Neurological: She is alert.  Skin: Skin is warm and dry.  Psychiatric: She  has a normal mood and affect.  Nursing note and vitals reviewed.   ED Course  Procedures (including critical care time) Labs Review Labs Reviewed  WET PREP, GENITAL  URINALYSIS, ROUTINE W REFLEX MICROSCOPIC  POC URINE PREG, ED  GC/CHLAMYDIA PROBE AMP ()    Imaging Review No results found.   EKG Interpretation None      MDM   Final diagnoses:  None   Patient presents today with a chief complaint of vaginal discharge.  Wet prep showing TNTC clue cells.  GC/Chlamydia pending.  Patient declined HIV testing.  Patient declined prophylactic  treatment for GC/Chlamydia.  Urine pregnancy negative.  No CMT or adnexal tenderness with pelvic exam.  Patient treated with 7 day course of Flagyl.  Patient instructed to not use Alcohol while taking Flagyl.  Stable for discharge.  Return precautions given.    Hyman Bible, PA-C 01/17/15 1622  Dot Lanes, MD 01/26/15 [phone]

## 2015-01-17 NOTE — ED Notes (Signed)
Pelvic cart set up at bedside  

## 2015-01-17 NOTE — ED Notes (Signed)
Patient endorses brown, fishy vaginal discharge x3 days. Patient sts shes has had unprotected sex with one partner. Patient denies pain, dysuria or hematuria.

## 2015-01-17 NOTE — ED Notes (Signed)
Heather PA at bedside updating patient.

## 2015-01-17 NOTE — Discharge Instructions (Signed)
You have been treated in the emergency department for an infection, possibly sexually transmitted. Results of your gonorrhea and chlamydia tests are pending and you will be notified if they are positive. It is very important to practice safe sex and use condoms when sexually active. If your results are positive you need to notify all sexual partners so they can be treated as well. The website https://dean.info/ can be used to send anonymous text messages or emails to alert sexual contacts.   Bacterial Vaginosis  Bacterial vaginosis (BV) is a vaginal infection where the normal balance of bacteria in the vagina is disrupted. This is not a sexually transmitted disease and your sexual partners do NOT need to be treated. CAUSES  The cause of BV is not fully understood. BV develops when there is an increase or imbalance of harmful bacteria.  Some activities or behaviors can upset the normal balance of bacteria in the vagina and put women at increased risk including:  Having a new sex partner or multiple sex partners.  Douching.  Using an intrauterine device (IUD) for contraception.  It is not clear what role sexual activity plays in the development of BV. However, women that have never had sexual intercourse are rarely infected with BV.  Women do not get BV from toilet seats, bedding, swimming pools or from touching objects around them.   SYMPTOMS  Grey vaginal discharge.  A fish-like odor with discharge, especially after sexual intercourse.  Itching or burning of the vagina and vulva.  Burning or pain with urination.  Some women have no signs or symptoms at all.   TREATMENT  Sometimes BV will clear up without treatment.  BV may be treated with antibiotics.  BV can recur after treatment. If this happens, a second round of antibiotics will often be prescribed.  HOME CARE INSTRUCTIONS  Finish all medication as directed by your caregiver.  Do not have sex until treatment is completed.  Do  NOT drink any alcoholic beverages while being treated  with Metronidazole (Flagyl). This will cause a severe reaction inducing vomiting.  RESOURCE GUIDE  Dental Problems  Patients with Medicaid: [place] Phone:  [phone]                                                  Phone:  [phone]  If unable to pay or uninsured, contact:  [facility] or [facility]. to become qualified for the adult dental clinic.  Chronic Pain Problems Contact [facility]  [phone] Patients need to be referred by their primary care doctor.  Insufficient Money for Medicine Contact [employer]:  call "211" or [place] [phone].  No Primary Care Doctor Call [facility]  [phone] Other agencies that provide inexpensive medical care    [place]  [facility]  [place]  [phone]  [facility]  [phone]    [facility]  [phone]    [place]  [phone] [facility]  [phone] [place]   [phone] (emergency services [phone])  Sub[facility] Abuse [facility]  [phone] [facility] [phone] [facility] [phone] [facility] [phone] Residential & Outpatient Substance Abuse Program  [phone]  Abuse/Neglect [place] [phone] [place] [phone] (After Hours)  Emergency [place] [phone]  [place] [facility] [place] [phone] [place] [phone]  MRSA Hotline #:   (331)489-7104    [facility] Dept. [address]      [place] Phone:  [phone]                                   Phone:  [phone]                 Phone:  [place] Phone:  [place] [phone] (After Hours)

## 2015-01-20 LAB — GC/CHLAMYDIA PROBE AMP (~~LOC~~) NOT AT ARMC
Chlamydia: NEGATIVE
Neisseria Gonorrhea: NEGATIVE

## 2015-02-27 ENCOUNTER — Emergency Department (HOSPITAL_COMMUNITY)
Admission: EM | Admit: 2015-02-27 | Discharge: 2015-02-27 | Disposition: A | Discharge status: Home / Self Care | Payer: Self-pay | Attending: Emergency Medicine | Admitting: Emergency Medicine

## 2015-02-27 ENCOUNTER — Encounter (HOSPITAL_COMMUNITY): Payer: Self-pay | Admitting: Emergency Medicine

## 2015-02-27 DIAGNOSIS — J45909 Unspecified asthma, uncomplicated: Secondary | ICD-10-CM | POA: Insufficient documentation

## 2015-02-27 DIAGNOSIS — Z720 Tobacco use: Secondary | ICD-10-CM | POA: Insufficient documentation

## 2015-02-27 DIAGNOSIS — Z792 Long term (current) use of antibiotics: Secondary | ICD-10-CM | POA: Insufficient documentation

## 2015-02-27 DIAGNOSIS — Z79899 Other long term (current) drug therapy: Secondary | ICD-10-CM | POA: Insufficient documentation

## 2015-02-27 DIAGNOSIS — Z3202 Encounter for pregnancy test, result negative: Secondary | ICD-10-CM | POA: Insufficient documentation

## 2015-02-27 DIAGNOSIS — R1030 Lower abdominal pain, unspecified: Secondary | ICD-10-CM

## 2015-02-27 DIAGNOSIS — N939 Abnormal uterine and vaginal bleeding, unspecified: Secondary | ICD-10-CM | POA: Insufficient documentation

## 2015-02-27 LAB — CBC WITH DIFFERENTIAL/PLATELET
BASOS PCT: 1 % (ref 0–1)
Basophils Absolute: 0 10*3/uL (ref 0.0–0.1)
EOS PCT: 1 % (ref 0–5)
Eosinophils Absolute: 0 10*3/uL (ref 0.0–0.7)
HCT: 41.2 % (ref 36.0–46.0)
Hemoglobin: 13 g/dL (ref 12.0–15.0)
LYMPHS ABS: 1.3 10*3/uL (ref 0.7–4.0)
Lymphocytes Relative: 30 % (ref 12–46)
MCH: 27.7 pg (ref 26.0–34.0)
MCHC: 31.6 g/dL (ref 30.0–36.0)
MCV: 87.8 fL (ref 78.0–100.0)
Monocytes Absolute: 0.6 10*3/uL (ref 0.1–1.0)
Monocytes Relative: 13 % — ABNORMAL HIGH (ref 3–12)
Neutro Abs: 2.4 10*3/uL (ref 1.7–7.7)
Neutrophils Relative %: 55 % (ref 43–77)
Platelets: 308 10*3/uL (ref 150–400)
RBC: 4.69 MIL/uL (ref 3.87–5.11)
RDW: 13.4 % (ref 11.5–15.5)
WBC: 4.3 10*3/uL (ref 4.0–10.5)

## 2015-02-27 LAB — I-STAT CHEM 8, ED
BUN: 8 mg/dL (ref 6–23)
Calcium, Ion: 1.23 mmol/L (ref 1.12–1.23)
Chloride: 102 mmol/L (ref 96–112)
Creatinine, Ser: 0.8 mg/dL (ref 0.50–1.10)
Glucose, Bld: 103 mg/dL — ABNORMAL HIGH (ref 70–99)
HEMATOCRIT: 46 % (ref 36.0–46.0)
HEMOGLOBIN: 15.6 g/dL — AB (ref 12.0–15.0)
Potassium: 3.7 mmol/L (ref 3.5–5.1)
SODIUM: 141 mmol/L (ref 135–145)
TCO2: 23 mmol/L (ref 0–100)

## 2015-02-27 LAB — I-STAT BETA HCG BLOOD, ED (MC, WL, AP ONLY)

## 2015-02-27 NOTE — ED Notes (Signed)
Pt st's she has been on her period for approx 1 month. Also having lower back pain

## 2015-02-27 NOTE — Discharge Instructions (Signed)
Abnormal Uterine Bleeding Abnormal uterine bleeding can affect women at various stages in life, including teenagers, women in their reproductive years, pregnant women, and women who have reached menopause. Several kinds of uterine bleeding are considered abnormal, including:  Bleeding or spotting between periods.   Bleeding after sexual intercourse.   Bleeding that is heavier or more than normal.   Periods that last longer than usual.  Bleeding after menopause.  Many cases of abnormal uterine bleeding are minor and simple to treat, while others are more serious. Any type of abnormal bleeding should be evaluated by your health care provider. Treatment will depend on the cause of the bleeding. HOME CARE INSTRUCTIONS Monitor your condition for any changes. The following actions may help to alleviate any discomfort you are experiencing:  Avoid the use of tampons and douches as directed by your health care provider.  Change your pads frequently. You should get regular pelvic exams and Pap tests. Keep all follow-up appointments for diagnostic tests as directed by your health care provider.  SEEK MEDICAL CARE IF:   Your bleeding lasts more than 1 week.   You feel dizzy at times.  SEEK IMMEDIATE MEDICAL CARE IF:   You pass out.   You are changing pads every 15 to 30 minutes.   You have abdominal pain.  You have a fever.   You become sweaty or weak.   You are passing large blood clots from the vagina.   You start to feel nauseous and vomit. MAKE SURE YOU:   Understand these instructions.  Will watch your condition.  Will get help right away if you are not doing well or get worse. Document Released: 11/08/2005 Document Revised: 11/13/2013 Document Reviewed: 06/07/2013 ExitCare Patient Information 2015 [employer]. This information is not intended to replace advice given to you by your health care provider. Make sure you discuss any questions you have with your  health care provider.  

## 2015-02-27 NOTE — ED Provider Notes (Signed)
CSN: [id number]     Arrival date & time 02/27/15  1816 History   First MD Initiated Contact with Patient 02/27/15 2115     Chief Complaint  Patient presents with  . Vaginal Bleeding     (Consider location/radiation/quality/duration/timing/severity/associated sxs/prior Treatment) HPI   This is a 24 yo female, G1P1, with no pertinent PMH, presenting today with vaginal bleeding.  Onset one month ago, per vagina, intermittent, ranging from light to heavy, resolved spontaneously three days ago.  Positive for associated suprapubic abdominal pain, non-radiating.  Negative for dysuria, hematuria, diarrhea, constipation, hematochezia, melena, vaginal discharge, nausea, or vomiting.  Pt has been completely asymptomatic for three days, and presents here to find out if she is pregnant.  Past Medical History  Diagnosis Date  . Asthma     exercise induced  . Seizures     febrile as a child   Past Surgical History  Procedure Laterality Date  . No past surgeries    . Cesarean section N/A 04/07/2013    Procedure: Primary cesarean section with delivery of baby boy at 65.  Apgars 4/7.;  Surgeon: Lavonia Drafts, MD;  Location: [facility];  Service: Obstetrics;  Laterality: N/A;   Family History  Problem Relation Age of Onset  . Hypertension Mother   . Hypertension Father   . Asthma Sister   . Hypertension Sister   . Diabetes Maternal Grandmother    History  Substance Use Topics  . Smoking status: Current Every Day Smoker -- 0.25 packs/day    Types: Cigarettes  . Smokeless tobacco: Not on file  . Alcohol Use: No   OB History    Gravida Para Term Preterm AB TAB SAB Ectopic Multiple Living   1 1 1  0 0 0 0 0 0 1     Review of Systems  Constitutional: Negative for fever and chills.  HENT: Negative for facial swelling.   Eyes: Negative for photophobia and pain.  Respiratory: Negative for cough and shortness of breath.   Cardiovascular: Negative for chest pain and leg swelling.   Gastrointestinal: Positive for abdominal pain (resolved). Negative for nausea and vomiting.  Genitourinary: Positive for vaginal bleeding (resolved). Negative for dysuria.  Musculoskeletal: Negative for arthralgias.  Skin: Negative for rash and wound.  Neurological: Negative for seizures.  Hematological: Negative for adenopathy.      Allergies  Review of patient's allergies indicates no known allergies.  Home Medications   Prior to Admission medications   Medication Sig Start Date End Date Taking? Authorizing Provider  albuterol (PROVENTIL HFA;VENTOLIN HFA) 108 (90 BASE) MCG/ACT inhaler Inhale 1 puff into the lungs every 6 (six) hours as needed for wheezing or shortness of breath.    Historical Provider, MD  metroNIDAZOLE (FLAGYL) 500 MG tablet Take 1 tablet (500 mg total) by mouth 2 (two) times daily. 01/17/15   Hyman Bible, PA-C  Tetrahydrozoline HCl (VISINE OP) Apply 1 drop to eye as needed (discoloration).    Historical Provider, MD   BP 132/79 mmHg  Pulse 81  Temp(Src) 98.8 F (37.1 C) (Oral)  Resp 16  Ht 5\' 5"  (1.651 m)  Wt 191 lb 5 oz (86.779 kg)  BMI 31.84 kg/m2  SpO2 100%  LMP 02/27/2015 Physical Exam  Constitutional: She is oriented to person, place, and time. She appears well-developed and well-nourished. No distress.  HENT:  Head: Normocephalic and atraumatic.  Mouth/Throat: No oropharyngeal exudate.  Eyes: Conjunctivae are normal. Pupils are equal, round, and reactive to light. No scleral icterus.  Neck:  Normal range of motion. No tracheal deviation present. No thyromegaly present.  Cardiovascular: Normal rate, regular rhythm and normal heart sounds.  Exam reveals no gallop and no friction rub.   No murmur heard. Pulmonary/Chest: Effort normal and breath sounds normal. No stridor. No respiratory distress. She has no wheezes. She has no rales. She exhibits no tenderness.  Abdominal: Soft. She exhibits no distension and no mass. There is no tenderness. There  is no rebound and no guarding.  Musculoskeletal: Normal range of motion. She exhibits no edema.  Neurological: She is alert and oriented to person, place, and time.  Skin: Skin is warm and dry. She is not diaphoretic.    ED Course  Procedures (including critical care time) Labs Review Labs Reviewed  CBC WITH DIFFERENTIAL/PLATELET - Abnormal; Notable for the following:    Monocytes Relative 13 (*)    All other components within normal limits  I-STAT CHEM 8, ED - Abnormal; Notable for the following:    Glucose, Bld 103 (*)    Hemoglobin 15.6 (*)    All other components within normal limits  I-STAT BETA HCG BLOOD, ED ([facility], [facility], [facility] ONLY)  POC URINE PREG, ED    Imaging Review No results found.   MDM   Final diagnoses:  None    This is a 24 yo female, G1P1, with no pertinent PMH, presenting today with vaginal bleeding.  Onset one month ago, per vagina, intermittent, ranging from light to heavy, resolved spontaneously three days ago.  Positive for associated suprapubic abdominal pain, non-radiating.  Negative for dysuria, hematuria, diarrhea, constipation, hematochezia, melena, vaginal discharge, nausea, or vomiting.  Pt has been completely asymptomatic for three days, and presents here to find out if she is pregnant.  On exam, negative for conjunctival pallor.  Good cap refill.  No flow murmur.  No signs or symptoms of anemia. Hg is stable.  Abdomen is benign.  Metabolic panel is without abnormalities.  Pt is not pregnant.  As pt is completely asymptomatic at this time and is no longer bleeding, she does not desire pelvic exam.  I do not believe that this exam is indicated.  No further eval or treatment is indicated at this time.  Good return precautions given.  Pt discharged in stable condition.  I have discussed case and care has been guided by my attending physician, Del[place].   Doy Hutching, MD 02/27/15 [place], MD 02/27/15 [phone]

## 2017-06-08 ENCOUNTER — Encounter (HOSPITAL_COMMUNITY): Payer: Self-pay | Admitting: Emergency Medicine

## 2017-06-08 ENCOUNTER — Emergency Department (HOSPITAL_COMMUNITY)
Admission: EM | Admit: 2017-06-08 | Discharge: 2017-06-08 | Disposition: A | Discharge status: Home / Self Care | Payer: Medicaid Other | Attending: Emergency Medicine | Admitting: Emergency Medicine

## 2017-06-08 DIAGNOSIS — F17210 Nicotine dependence, cigarettes, uncomplicated: Secondary | ICD-10-CM | POA: Diagnosis not present

## 2017-06-08 DIAGNOSIS — R1030 Lower abdominal pain, unspecified: Secondary | ICD-10-CM | POA: Insufficient documentation

## 2017-06-08 DIAGNOSIS — N939 Abnormal uterine and vaginal bleeding, unspecified: Secondary | ICD-10-CM | POA: Diagnosis not present

## 2017-06-08 DIAGNOSIS — J45909 Unspecified asthma, uncomplicated: Secondary | ICD-10-CM | POA: Insufficient documentation

## 2017-06-08 DIAGNOSIS — N898 Other specified noninflammatory disorders of vagina: Secondary | ICD-10-CM

## 2017-06-08 LAB — I-STAT BETA HCG BLOOD, ED (MC, WL, AP ONLY): I-stat hCG, quantitative: <5 m[IU]/mL (ref <5)

## 2017-06-08 LAB — URINALYSIS, ROUTINE W REFLEX MICROSCOPIC
Bacteria, UA: NONE SEEN
Bilirubin Urine: NEGATIVE
Glucose, UA: NEGATIVE mg/dL
Hgb urine dipstick: NEGATIVE
Ketones, ur: NEGATIVE mg/dL
Nitrite: NEGATIVE
PH: 7 (ref 5.0–8.0)
Protein, ur: NEGATIVE mg/dL
SPECIFIC GRAVITY, URINE: 1.017 (ref 1.005–1.030)

## 2017-06-08 LAB — WET PREP, GENITAL
Sperm: NONE SEEN
TRICH WET PREP: NONE SEEN
YEAST WET PREP: NONE SEEN

## 2017-06-08 LAB — COMPREHENSIVE METABOLIC PANEL
ALT: 19 U/L (ref 14–54)
AST: 17 U/L (ref 15–41)
Albumin: 4.1 g/dL (ref 3.5–5.0)
Alkaline Phosphatase: 77 U/L (ref 38–126)
Anion gap: 8 (ref 5–15)
CHLORIDE: 105 mmol/L (ref 101–111)
CO2: 27 mmol/L (ref 22–32)
CREATININE: 0.72 mg/dL (ref 0.44–1.00)
Calcium: 9.5 mg/dL (ref 8.9–10.3)
GFR calc Af Amer: >60 mL/min (ref >60)
Glucose, Bld: 100 mg/dL — ABNORMAL HIGH (ref 65–99)
Potassium: 4.3 mmol/L (ref 3.5–5.1)
Sodium: 140 mmol/L (ref 135–145)
Total Bilirubin: 0.3 mg/dL (ref 0.3–1.2)
Total Protein: 7.5 g/dL (ref 6.5–8.1)

## 2017-06-08 LAB — CBC
HCT: 41.7 % (ref 36.0–46.0)
Hemoglobin: 13.3 g/dL (ref 12.0–15.0)
MCH: 28 pg (ref 26.0–34.0)
MCHC: 31.9 g/dL (ref 30.0–36.0)
MCV: 87.8 fL (ref 78.0–100.0)
PLATELETS: 292 10*3/uL (ref 150–400)
RBC: 4.75 MIL/uL (ref 3.87–5.11)
RDW: 13.4 % (ref 11.5–15.5)
WBC: 7.6 10*3/uL (ref 4.0–10.5)

## 2017-06-08 LAB — LIPASE, BLOOD: LIPASE: 13 U/L (ref 11–51)

## 2017-06-08 NOTE — ED Triage Notes (Signed)
Pt complaint of ongoing vaginal discharge, bleeding, and cramping for a few weeks.

## 2017-06-08 NOTE — ED Provider Notes (Signed)
[facility] DEPT Provider Note   CSN: [id number] Arrival date & time: 06/08/17  0815     History   Chief Complaint Chief Complaint  Patient presents with  . Vaginal Discharge  . Vaginal Bleeding    HPI Alison Archer is a 26 y.o. female.  HPI Patient presenting with malodorous vaginal discharge for multiple weeks. Also reports intermittent spotting and cramping that began one week after her menstrual period in June, and lasted untill her LMP ended on July 8. LMP was July 5.  Cramping, and spotting have resolved since July 8, however she is seeking alternative treatment for her recurrent BV. Patient states she has been having an ongoing issue of controlling recurrent bacterial vaginosis. She's been seen in the health department for this issue, recently was on a six-month suppression treatment consisting of Flagyl once per month, however after 3-4 months treatment was unsuccessful.  States she is currently sexually active with 1 female partner, without protection. Recent STD panel done 1 month ago was normal. Denies urinary symptoms, nausea, vomiting, fever, chills, other abdominal pain, or any other symptoms today. Past Medical History:  Diagnosis Date  . Asthma    exercise induced  . Seizures ([place])    febrile as a child    There are no active problems to display for this patient.   Past Surgical History:  Procedure Laterality Date  . CESAREAN SECTION N/A 04/07/2013   Procedure: Primary cesarean section with delivery of baby boy at 48.  Apgars 4/7.;  Surgeon: Lavonia Drafts, MD;  Location: [facility];  Service: Obstetrics;  Laterality: N/A;  . NO PAST SURGERIES      OB History    Gravida Para Term Preterm AB Living   1 1 1  0 0 1   SAB TAB Ectopic Multiple Live Births   0 0 0 0 1       Home Medications    Prior to Admission medications   Not on File    Family History Family History  Problem Relation Age of Onset  . Hypertension Mother   . Hypertension  Father   . Asthma Sister   . Hypertension Sister   . Diabetes Maternal Grandmother     Social History Social History  Substance Use Topics  . Smoking status: Current Every Day Smoker    Packs/day: 0.25    Types: Cigarettes  . Smokeless tobacco: Not on file  . Alcohol use No     Allergies   Patient has no known allergies.   Review of Systems Review of Systems  Constitutional: Negative for chills and fever.  Gastrointestinal: Positive for abdominal pain (b/l lower cramping). Negative for diarrhea, nausea and vomiting.  Genitourinary: Positive for vaginal bleeding and vaginal discharge. Negative for dysuria, frequency, hematuria and vaginal pain.  Musculoskeletal: Negative for back pain.     Physical Exam Updated Vital Signs BP 136/89 (BP Location: Right Arm)   Pulse 63   Temp 99 F (37.2 C) (Oral)   Resp 14   Ht 5\' 5"  (1.651 m)   Wt 97.1 kg (214 lb)   LMP 04/26/2017   SpO2 98%   BMI 35.61 kg/m   Physical Exam  Constitutional: She appears well-developed and well-nourished. No distress.  Well-appearing  HENT:  Head: Normocephalic and atraumatic.  Eyes: Conjunctivae are normal.  Cardiovascular: Normal rate, regular rhythm, normal heart sounds and intact distal pulses.   Pulmonary/Chest: Effort normal and breath sounds normal.  Abdominal: Soft. Bowel sounds are normal. She exhibits  no distension. There is no tenderness. There is no rebound and no guarding. No hernia.  Genitourinary: Uterus normal. There is no rash or tenderness on the right labia. There is no rash or tenderness on the left labia. Cervix exhibits discharge (moderate amount of white d/c). Cervix exhibits no motion tenderness and no friability. Right adnexum displays no tenderness and no fullness. Left adnexum displays no tenderness and no fullness. No erythema, tenderness or bleeding in the vagina. No foreign body in the vagina.  Genitourinary Comments: Exam performed with RN chaperone present.    Psychiatric: She has a normal mood and affect. Her behavior is normal.  Nursing note and vitals reviewed.    ED Treatments / Results  Labs (all labs ordered are listed, but only abnormal results are displayed) Labs Reviewed  WET PREP, GENITAL - Abnormal; Notable for the following:       Result Value   Clue Cells Wet Prep HPF POC PRESENT (*)    WBC, Wet Prep HPF POC FEW (*)    All other components within normal limits  COMPREHENSIVE METABOLIC PANEL - Abnormal; Notable for the following:    Glucose, Bld 100 (*)    BUN <5 (*)    All other components within normal limits  URINALYSIS, ROUTINE W REFLEX MICROSCOPIC - Abnormal; Notable for the following:    APPearance HAZY (*)    Leukocytes, UA TRACE (*)    Squamous Epithelial / LPF 6-30 (*)    All other components within normal limits  LIPASE, BLOOD  CBC  I-STAT BETA HCG BLOOD, ED ([facility], [facility], [facility] ONLY)  GC/CHLAMYDIA PROBE AMP ([place]) NOT AT [facility]    EKG  EKG Interpretation None       Radiology No results found.  Procedures Procedures (including critical care time)  Medications Ordered in ED Medications - No data to display   Initial Impression / Assessment and Plan / ED Course  I have reviewed the triage vital signs and the nursing notes.  Pertinent labs & imaging results that were available during my care of the patient were reviewed by me and considered in my medical decision making (see chart for details).    Pt w vaginal discharge, and resolved cramping and spotting. Pt with ongoing recurrent BV. Pelvic exam with moderate amount of white discharge, no CMT or adnexal tenderness, cervix is not friable. Abd exam benign. Not concerning for PID. Patient is afebrile, nontoxic, nonseptic appearing, in no apparent distress.  CBC, CMP, lipase unremarkable. U/A without evidence of UTI. bHCG neg. Wet prep with few WBC and clue cells present. Given pt's ongoing issues with BV and treatment failure with flagyl, recommend pt  follow up with gynecology for further workup and alternative treatment options.  Discussed importance of using protection when sexually active. Pt understands that they have GC/Chlamydia cultures pending and that they will need to inform all sexual partners if results return positive. Pt safe for discharge.  Discussed results, findings, treatment and follow up. Patient advised of return precautions. Patient verbalized understanding and agreed with plan.   Final Clinical Impressions(s) / ED Diagnoses   Final diagnoses:  Vaginal discharge    New Prescriptions There are no discharge medications for this patient.    Russo, [place] N, PA-C 06/08/17 1401    Lacretia Leigh, MD 06/09/17 0930

## 2017-06-08 NOTE — Discharge Instructions (Signed)
Please schedule an appointment with the Women's clinic to establish gynecologic care and follow up on your ongoing concerns. You will receive a call from the hospital if your test results come back positive. Avoid sexual activity until you know your test results. If your results come back positive, it is important that you inform all of your sexual partners. Drink plenty of water. You can take advil or tylenol as needed for pain.  Return to the ER for new or worsening symptoms.

## 2017-06-09 LAB — GC/CHLAMYDIA PROBE AMP (~~LOC~~) NOT AT ARMC
CHLAMYDIA, DNA PROBE: NEGATIVE
NEISSERIA GONORRHEA: NEGATIVE

## 2018-07-06 ENCOUNTER — Ambulatory Visit (HOSPITAL_COMMUNITY)
Admission: EM | Admit: 2018-07-06 | Discharge: 2018-07-06 | Disposition: A | Discharge status: Home / Self Care | Payer: Medicaid Other | Attending: Family Medicine | Admitting: Family Medicine

## 2018-07-06 ENCOUNTER — Ambulatory Visit (INDEPENDENT_AMBULATORY_CARE_PROVIDER_SITE_OTHER): Payer: Medicaid Other

## 2018-07-06 ENCOUNTER — Encounter (HOSPITAL_COMMUNITY): Payer: Self-pay | Admitting: Emergency Medicine

## 2018-07-06 DIAGNOSIS — R110 Nausea: Secondary | ICD-10-CM | POA: Diagnosis not present

## 2018-07-06 DIAGNOSIS — R0789 Other chest pain: Secondary | ICD-10-CM | POA: Diagnosis not present

## 2018-07-06 DIAGNOSIS — R109 Unspecified abdominal pain: Secondary | ICD-10-CM

## 2018-07-06 DIAGNOSIS — R195 Other fecal abnormalities: Secondary | ICD-10-CM | POA: Diagnosis not present

## 2018-07-06 DIAGNOSIS — N898 Other specified noninflammatory disorders of vagina: Secondary | ICD-10-CM

## 2018-07-06 LAB — POCT I-STAT, CHEM 8
BUN: 5 mg/dL — ABNORMAL LOW (ref 6–20)
Calcium, Ion: 1.24 mmol/L (ref 1.15–1.40)
Chloride: 102 mmol/L (ref 98–111)
Creatinine, Ser: 0.8 mg/dL (ref 0.44–1.00)
Glucose, Bld: 88 mg/dL (ref 70–99)
HEMATOCRIT: 46 % (ref 36.0–46.0)
Hemoglobin: 15.6 g/dL — ABNORMAL HIGH (ref 12.0–15.0)
Potassium: 4.2 mmol/L (ref 3.5–5.1)
Sodium: 139 mmol/L (ref 135–145)
TCO2: 25 mmol/L (ref 22–32)

## 2018-07-06 LAB — POCT URINALYSIS DIP (DEVICE)
Bilirubin Urine: NEGATIVE
GLUCOSE, UA: NEGATIVE mg/dL
Hgb urine dipstick: NEGATIVE
Ketones, ur: NEGATIVE mg/dL
NITRITE: NEGATIVE
Protein, ur: 30 mg/dL — AB
Specific Gravity, Urine: 1.025 (ref 1.005–1.030)
Urobilinogen, UA: 1 mg/dL (ref 0.0–1.0)
pH: 7 (ref 5.0–8.0)

## 2018-07-06 MED ORDER — IBUPROFEN 800 MG PO TABS: 800.0000 mg | ORAL_TABLET | Freq: Three times a day (TID) | ORAL | 0 refills | Status: DC

## 2018-07-06 MED ORDER — LIDOCAINE-EPINEPHRINE (PF) 2 %-1:200000 IJ SOLN
INTRAMUSCULAR | Status: AC
  Filled 2018-07-06: qty 20

## 2018-07-06 NOTE — ED Provider Notes (Signed)
[place]    CSN: [id number] Arrival date & time: 07/06/18  1229     History   Chief Complaint Chief Complaint  Patient presents with  . Abdominal Pain    HPI Alison Archer is a 27 y.o. female.   HPI  Patient is here for right-sided abdominal pain.  She states is becoming progressively worse over the last 2 to 3 days.  No accident or injury.  No fall.  No heavy lifting.  she states that hurts in the right upper abdomen, right lower rib cage underneath her right breast.  It hurts to take a deep breath or cough.  Hurts to lay on her right side.  She has a decreased appetite and mild nausea.  No vomiting.  Normal bowel movements.  No fever or chills.  No recent illness.  No cough.  The only abdominal surgery she has had a cesarean section 5 years ago.  No relationship to food or meals.  No history of colon disorder colitis, no history of hepatitis or liver disease.  No history of ulcers or GERD.  She is not on any prescription medications.  She is not taking any over-the-counter medicines.  She is on a regular diet.  She does not drink alcohol.  No Tylenol or ibuprofen.  Past Medical History:  Diagnosis Date  . Asthma    exercise induced  . Seizures ([place])    febrile as a child    There are no active problems to display for this patient.   Past Surgical History:  Procedure Laterality Date  . CESAREAN SECTION N/A 04/07/2013   Procedure: Primary cesarean section with delivery of baby boy at 82.  Apgars 4/7.;  Surgeon: Lavonia Drafts, MD;  Location: [facility];  Service: Obstetrics;  Laterality: N/A;  . NO PAST SURGERIES      OB History    Gravida  1   Para  1   Term  1   Preterm  0   AB  0   Living  1     SAB  0   TAB  0   Ectopic  0   Multiple  0   Live Births  1            Home Medications    Prior to Admission medications   Medication Sig Start Date End Date Taking? Authorizing Provider  ibuprofen (ADVIL,MOTRIN) 800 MG tablet  Take 1 tablet (800 mg total) by mouth 3 (three) times daily. 07/06/18   Raylene Everts, MD    Family History Family History  Problem Relation Age of Onset  . Hypertension Mother   . Hypertension Father   . Asthma Sister   . Hypertension Sister   . Diabetes Maternal Grandmother     Social History Social History   Tobacco Use  . Smoking status: Current Every Day Smoker    Packs/day: 0.25    Types: Cigarettes  Substance Use Topics  . Alcohol use: No  . Drug use: Yes    Types: Marijuana    Comment: not used recently     Allergies   Patient has no known allergies.   Review of Systems Review of Systems  Constitutional: Positive for appetite change. Negative for chills and fever.  HENT: Negative for ear pain and sore throat.   Eyes: Negative for pain and visual disturbance.  Respiratory: Negative for cough and shortness of breath.   Cardiovascular: Negative for chest pain and palpitations.  Gastrointestinal: Positive  for abdominal pain and nausea. Negative for vomiting.  Genitourinary: Positive for vaginal discharge. Negative for dysuria and hematuria.       No discharge itching or discomfort, odor, "might have BV"  Musculoskeletal: Negative for arthralgias and back pain.  Skin: Negative for color change and rash.  Neurological: Negative for seizures and syncope.  Psychiatric/Behavioral: Negative.   All other systems reviewed and are negative.    Physical Exam Triage Vital Signs ED Triage Vitals [07/06/18 1314]  Enc Vitals Group     BP 140/75     Pulse Rate 83     Resp 18     Temp 98.4 F (36.9 C)     Temp Source Oral     SpO2 97 %   No data found.  Updated Vital Signs BP 140/75 (BP Location: Left Arm)   Pulse 83   Temp 98.4 F (36.9 C) (Oral)   Resp 18   SpO2 97%   Physical Exam  Constitutional: She is oriented to person, place, and time. She appears well-developed and well-nourished. She appears ill. No distress.  Appears uncomfortable  HENT:    Head: Normocephalic and atraumatic.  Mouth/Throat: Oropharynx is clear and moist.  Mucous membranes are moist  Eyes: Pupils are equal, round, and reactive to light. Conjunctivae are normal.  Neck: Normal range of motion.  Cardiovascular: Normal rate, regular rhythm and normal heart sounds.  No murmur heard. Pulmonary/Chest: Effort normal and breath sounds normal. No respiratory distress. She has no rhonchi. She has no rales.  Chest wall tenderness to pressure on the right lower ribs beneath the breast.  No palpable deformity.  No crepitus.  No ecchymosis of skin or rash.  Abdominal: Soft. Bowel sounds are normal. She exhibits no distension. There is no hepatosplenomegaly. There is tenderness in the right upper quadrant and epigastric area. There is no rigidity, no rebound, no guarding and no CVA tenderness.  Abdomen obese.  Soft.  Tender to palpation in the right upper quadrant and midepigastrium to deep palpation.  No guarding or rebound.  No organomegaly.  Musculoskeletal: Normal range of motion. She exhibits no edema.  Neurological: She is alert and oriented to person, place, and time.  Skin: Skin is warm and dry.     UC Treatments / Results  Labs (all labs ordered are listed, but only abnormal results are displayed) Labs Reviewed  POCT URINALYSIS DIP (DEVICE) - Abnormal; Notable for the following components:      Result Value   Protein, ur 30 (*)    Leukocytes, UA SMALL (*)    All other components within normal limits  POCT I-STAT, CHEM 8 - Abnormal; Notable for the following components:   BUN 5 (*)    Hemoglobin 15.6 (*)    All other components within normal limits    EKG None  Radiology Dg Abdomen Acute W/chest  Result Date: 07/06/2018 CLINICAL DATA:  Upper abdominal pain EXAM: DG ABDOMEN ACUTE W/ 1V CHEST COMPARISON:  Chest radiograph September 10, 2008; abdomen series October 24, 2006 FINDINGS: PA chest: Lungs are clear. Heart size and pulmonary vascularity are normal.  No adenopathy. Supine and upright abdomen: There is moderate stool throughout the colon. There is no appreciable bowel dilatation or air-fluid level to suggest bowel obstruction. No free air. There are small phleboliths in the pelvis. IMPRESSION: No evident bowel obstruction or free air. Moderate stool in colon. No lung edema or consolidation. Electronically Signed   By: Lowella Grip III M.D.   On:  07/06/2018 13:48    Procedures Procedures (including critical care time)  Medications Ordered in UC Medications - No data to display  Initial Impression / Assessment and Plan / UC Course  I have reviewed the triage vital signs and the nursing notes.  Pertinent labs & imaging results that were available during my care of the patient were reviewed by me and considered in my medical decision making (see chart for details).    Discussed labs and x-ray are negative.  She may have some element of constipation.  She is to go home and increase the fiber in her diet, drink plenty of water.  Take ibuprofen 3 times a day with food.  Strict return precautions were discussed. Final Clinical Impressions(s) / UC Diagnoses   Final diagnoses:  Acute chest wall pain     Discharge Instructions     Take ibuprofen 3 times a day with food Drink plenty of clear liquids.  Bland diet as tolerated. This should improve over the next couple of days.  If you get worse instead of better, start vomiting, or have increasing pain you need to come back here or to the emergency room    ED Prescriptions    Medication Sig Dispense Auth. Provider   ibuprofen (ADVIL,MOTRIN) 800 MG tablet Take 1 tablet (800 mg total) by mouth 3 (three) times daily. 21 tablet Raylene Everts, MD     Controlled Substance Prescriptions [place] Controlled Substance Registry consulted? Not Applicable   Raylene Everts, MD 07/06/18 1420

## 2018-07-06 NOTE — ED Triage Notes (Signed)
Pt sts right sided abd pain x 2 days

## 2018-07-06 NOTE — Discharge Instructions (Addendum)
Take ibuprofen 3 times a day with food Drink plenty of clear liquids.  Bland diet as tolerated. This should improve over the next couple of days.  If you get worse instead of better, start vomiting, or have increasing pain you need to come back here or to the emergency room

## 2020-04-11 ENCOUNTER — Encounter (HOSPITAL_COMMUNITY): Payer: Self-pay | Admitting: Emergency Medicine

## 2020-04-11 ENCOUNTER — Emergency Department (HOSPITAL_COMMUNITY)
Admission: EM | Admit: 2020-04-11 | Discharge: 2020-04-11 | Disposition: A | Discharge status: Home / Self Care | Payer: Medicaid Other | Attending: Emergency Medicine | Admitting: Emergency Medicine

## 2020-04-11 DIAGNOSIS — L02412 Cutaneous abscess of left axilla: Secondary | ICD-10-CM | POA: Diagnosis not present

## 2020-04-11 DIAGNOSIS — Z791 Long term (current) use of non-steroidal anti-inflammatories (NSAID): Secondary | ICD-10-CM | POA: Diagnosis not present

## 2020-04-11 DIAGNOSIS — F17210 Nicotine dependence, cigarettes, uncomplicated: Secondary | ICD-10-CM | POA: Insufficient documentation

## 2020-04-11 DIAGNOSIS — R2232 Localized swelling, mass and lump, left upper limb: Secondary | ICD-10-CM | POA: Diagnosis present

## 2020-04-11 DIAGNOSIS — J45909 Unspecified asthma, uncomplicated: Secondary | ICD-10-CM | POA: Diagnosis not present

## 2020-04-11 LAB — PREGNANCY, URINE: Preg Test, Ur: NEGATIVE

## 2020-04-11 MED ORDER — OXYCODONE-ACETAMINOPHEN 5-325 MG PO TABS: 1.0000 | ORAL_TABLET | Freq: Four times a day (QID) | ORAL | 0 refills | Status: DC | PRN

## 2020-04-11 MED ORDER — DOXYCYCLINE HYCLATE 100 MG PO CAPS
100.0000 mg | ORAL_CAPSULE | Freq: Two times a day (BID) | ORAL | 0 refills | Status: DC
Start: 2020-04-11 — End: 2021-04-02

## 2020-04-11 MED ORDER — LIDOCAINE-EPINEPHRINE (PF) 2 %-1:200000 IJ SOLN
10.0000 mL | Freq: Once | INTRAMUSCULAR | Status: AC
  Administered 2020-04-11: 10 mL
  Filled 2020-04-11: qty 20

## 2020-04-11 NOTE — ED Triage Notes (Signed)
Pt reports a very large abscess to left axilla. Pt states it began 3 days ago.

## 2020-04-11 NOTE — ED Notes (Signed)
Patient verbalizes understanding of discharge instructions . Opportunity for questions and answers were provided . Armband removed by staff ,Pt discharged from ED. W/C  offered at D/C  and Declined W/C at D/C and was escorted to lobby by RN.  

## 2020-04-11 NOTE — ED Provider Notes (Addendum)
[facility] EMERGENCY DEPARTMENT Provider Note   CSN: [id number] Arrival date & time: 04/11/20  [id number]     History Chief Complaint  Patient presents with  . Abscess    Alison Archer is a 29 y.o. female with a past medical history of asthma, febrile seizures as a child, who presents today for evaluation of abscess.  She reports that 3 days ago she noticed a painful swelling in her left axilla.  She has tried heat with out relief.  The pain and swelling are worsening.  She reports that she stopped smoking about 6 months ago.  No fevers.    HPI     Past Medical History:  Diagnosis Date  . Asthma    exercise induced  . Seizures ([place])    febrile as a child    There are no problems to display for this patient.   Past Surgical History:  Procedure Laterality Date  . CESAREAN SECTION N/A 04/07/2013   Procedure: Primary cesarean section with delivery of baby boy at 30.  Apgars 4/7.;  Surgeon: Lavonia Drafts, MD;  Location: [facility];  Service: Obstetrics;  Laterality: N/A;  . NO PAST SURGERIES       OB History    Gravida  1   Para  1   Term  1   Preterm  0   AB  0   Living  1     SAB  0   TAB  0   Ectopic  0   Multiple  0   Live Births  1           Family History  Problem Relation Age of Onset  . Hypertension Mother   . Hypertension Father   . Asthma Sister   . Hypertension Sister   . Diabetes Maternal Grandmother     Social History   Tobacco Use  . Smoking status: Current Every Day Smoker    Packs/day: 0.25    Types: Cigarettes  Substance Use Topics  . Alcohol use: No  . Drug use: Yes    Types: Marijuana    Comment: not used recently    Home Medications Prior to Admission medications   Medication Sig Start Date End Date Taking? Authorizing Provider  doxycycline (VIBRAMYCIN) 100 MG capsule Take 1 capsule (100 mg total) by mouth 2 (two) times daily. 04/11/20   Lorin Glass, PA-C  ibuprofen (ADVIL,MOTRIN)  800 MG tablet Take 1 tablet (800 mg total) by mouth 3 (three) times daily. 07/06/18   Raylene Everts, MD  oxyCODONE-acetaminophen (PERCOCET/ROXICET) 5-325 MG tablet Take 1 tablet by mouth every 6 (six) hours as needed for severe pain. 04/11/20   Lorin Glass, PA-C    Allergies    Patient has no known allergies.  Review of Systems   Review of Systems  Constitutional: Negative for chills and fever.  Skin: Negative for color change and wound.       Swelling in left adnexa.   All other systems reviewed and are negative.   Physical Exam Updated Vital Signs BP 136/80 (BP Location: Right Arm)   Pulse 87   Temp 99.2 F (37.3 C) (Oral)   Resp 15   Ht 5' 5.5" (1.664 m)   Wt 102.1 kg   SpO2 99%   BMI 36.87 kg/m   Physical Exam Vitals and nursing note reviewed.  Constitutional:      General: She is not in acute distress.    Appearance: She is  not ill-appearing.  HENT:     Head: Normocephalic.  Cardiovascular:     Rate and Rhythm: Normal rate.  Pulmonary:     Effort: Pulmonary effort is normal. No respiratory distress.  Skin:    Comments: There is a 5cm by 2cm swelling in the left axilla.  Swelling is painful to touch and mildly erythematous.  No surrounding induration.  Patient unable to tolerate assessment for fluctuance.   Neurological:     Mental Status: She is alert.     ED Results / Procedures / Treatments   Labs (all labs ordered are listed, but only abnormal results are displayed) Labs Reviewed  PREGNANCY, URINE    EKG None  Radiology No results found.  Procedures .Marland KitchenIncision and Drainage  Date/Time: 04/11/2020 1:22 PM Performed by: Lorin Glass, PA-C Authorized by: Lorin Glass, PA-C   Consent:    Consent obtained:  Verbal   Consent given by:  Patient   Risks discussed:  Bleeding, incomplete drainage, pain and infection (Damage to other structures, need for additional procedures)   Alternatives discussed:  No treatment,  alternative treatment and referral Location:    Type:  Abscess   Size:  5cmx2cm   Location:  Trunk   Trunk location: Left axilla. Pre-procedure details:    Skin preparation:  Chloraprep Anesthesia (see MAR for exact dosages):    Anesthesia method:  Local infiltration and topical application   Topical anesthesia: Cold spray.   Local anesthetic:  Lidocaine 2% WITH epi Procedure type:    Complexity:  Complex Procedure details:    Incision types:  Stab incision   Incision depth:  Subcutaneous   Scalpel blade:  11   Wound management:  Probed and deloculated and irrigated with saline   Drainage:  Purulent and bloody   Drainage amount:  Moderate   Packing materials:  1/4 in gauze   Amount 1/4":  One piece Post-procedure details:    Patient tolerance of procedure:  Tolerated well, no immediate complications Comments:     After cold spray CHX was again applied prior to the injection of lidocaine.    (including critical care time)  Medications Ordered in ED Medications  lidocaine-EPINEPHrine (XYLOCAINE W/EPI) 2 %-1:200000 (PF) injection 10 mL (10 mLs Infiltration Given 04/11/20 1244)    ED Course  I have reviewed the triage vital signs and the nursing notes.  Pertinent labs & imaging results that were available during my care of the patient were reviewed by me and considered in my medical decision making (see chart for details).  Clinical Course as of Apr 12 2131  Fri Apr 11, 2020  1334 Was informed that patient is now febrile and, after we discussed risks of doxycycline while pregnant she now wishes for a pregnancy test.  U preg ordered, will recheck temp in about 15 minutes to see if elevated.    [EH]  1411 Suspect error, repeat is 98.7, consistent with the reported 98.8 on arrival.  Temp(!): 100.4 F (38 C) [EH]    Clinical Course User Index [EH] Ollen Gross   MDM Rules/Calculators/A&P                     Patient is a 29 year old woman who presents today  for evaluation of an abscess in her left axilla worsening over the past 3 days.  On exam approximately 5 cm x 2 cm left axillary abscess.  I&D was performed after patient gave informed verbal consent, please see procedure  note.  Packing was placed.  She is instructed on home care and need to follow-up for packing removal.  Her Tdap is up-to-date.  After [facility] PMP is consulted she is given a prescription for short course of Percocet with instructions on ibuprofen and Tylenol in addition and using the Percocet only as needed for severe, uncontrolled pain.  We discussed the risks of p.o. narcotics and she states her understanding.  We will give prescription for doxycycline.  She  initially declines possibility of pregnancy or need for pregnancy test after we discussed the risks of taking doxycycline while pregnant, however then decided she needed one.  As this could change needed treatment it was ordered in the ER and was negative.    She did have a temp of 100.4 on initial discharge vitals, however on repeat (without antipyretics being given) she was afebrile and almost the same temp as when she arrived.  Suspect false reading, instructed to monitor temp at home.   She is to return to the ER, PCP, or urgent care in 2 days for wound recheck and packing removal, or sooner if new or concerning symptoms develop.  Return precautions were discussed with patient who states their understanding.  At the time of discharge patient denied any unaddressed complaints or concerns.  Patient is agreeable for discharge home.  Note: Portions of this report may have been transcribed using voice recognition software. Every effort was made to ensure accuracy; however, inadvertent computerized transcription errors may be present   Final Clinical Impression(s) / ED Diagnoses Final diagnoses:  Abscess of left axilla    Rx / DC Orders ED Discharge Orders         Ordered    oxyCODONE-acetaminophen  (PERCOCET/ROXICET) 5-325 MG tablet  Every 6 hours PRN     04/11/20 1320    doxycycline (VIBRAMYCIN) 100 MG capsule  2 times daily     04/11/20 1320              Ollen Gross 04/11/20 2132    Sherwood Gambler, MD 04/12/20 [phone]

## 2020-04-11 NOTE — ED Notes (Signed)
NT reported to PA Pt oral temp and request for preg test.

## 2020-04-11 NOTE — Discharge Instructions (Signed)
Please take Ibuprofen (Advil, motrin) and Tylenol (acetaminophen) to relieve your pain.  You may take up to 600 MG (3 pills) of normal strength ibuprofen every 8 hours as needed.  In between doses of ibuprofen you make take tylenol, up to 1,000 mg (two extra strength pills).  Do not take more than 3,000 mg tylenol in a 24 hour period.  Please check all medication labels as many medications such as pain and cold medications may contain tylenol.  Do not drink alcohol while taking these medications.  Do not take other NSAID'S while taking ibuprofen (such as aleve or naproxen).  Please take ibuprofen with food to decrease stomach upset.  You may have diarrhea from the antibiotics.  It is very important that you continue to take the antibiotics even if you get diarrhea unless a medical professional tells you that you may stop taking them.  If you stop too early the bacteria you are being treated for will become stronger and you may need different, more powerful antibiotics that have more side effects and worsening diarrhea.  Please stay well hydrated and consider probiotics as they may decrease the severity of your diarrhea.  Please be aware that if you take any hormonal contraception (birth control pills, nexplanon, the ring, etc) that your birth control will not work while you are taking antibiotics and you need to use back up protection as directed on the birth control medication information insert.   You are being prescribed a medication which may make you sleepy. For 24 hours after one dose please do not drive, operate heavy machinery, care for a small child with out another adult present, or perform any activities that may cause harm to you or someone else if you were to fall asleep or be impaired.   If you may be pregnant then it is important that you take a pregnancy test before taking the antibiotic as it can permanently discolor a unborn child's baby teeth and adult teeth

## 2020-04-14 ENCOUNTER — Emergency Department (HOSPITAL_COMMUNITY)
Admission: EM | Admit: 2020-04-14 | Discharge: 2020-04-14 | Disposition: A | Discharge status: Home / Self Care | Payer: Medicaid Other | Attending: Emergency Medicine | Admitting: Emergency Medicine

## 2020-04-14 ENCOUNTER — Other Ambulatory Visit: Payer: Self-pay

## 2020-04-14 DIAGNOSIS — F17210 Nicotine dependence, cigarettes, uncomplicated: Secondary | ICD-10-CM | POA: Insufficient documentation

## 2020-04-14 DIAGNOSIS — Z79899 Other long term (current) drug therapy: Secondary | ICD-10-CM | POA: Insufficient documentation

## 2020-04-14 DIAGNOSIS — Z4800 Encounter for change or removal of nonsurgical wound dressing: Secondary | ICD-10-CM

## 2020-04-14 DIAGNOSIS — Z4801 Encounter for change or removal of surgical wound dressing: Secondary | ICD-10-CM | POA: Insufficient documentation

## 2020-04-14 DIAGNOSIS — L02412 Cutaneous abscess of left axilla: Secondary | ICD-10-CM | POA: Insufficient documentation

## 2020-04-14 DIAGNOSIS — Z5189 Encounter for other specified aftercare: Secondary | ICD-10-CM

## 2020-04-14 NOTE — Discharge Instructions (Signed)
You have opted out of having the wound repacked today.  Start taking your antibiotics as prescribed Keep wound clean and dry Follow up with your PCP in 1 week for recheck Return to the ED IMMEDIATELY for any worsening symptoms including continuation of copious drainage, abscess increasing in size, fevers > 100.4, or any other new/concerning symptoms.

## 2020-04-14 NOTE — ED Triage Notes (Signed)
Pt here for packing removal from L axilla where she had an abscess drained here three days ago. Denies drainage or pain at the site.

## 2020-04-14 NOTE — ED Provider Notes (Signed)
[facility] EMERGENC[facility] DEPARTMENT Provider Note   CSN: [id number] Arrival date & time: 04/14/20  1055     History Chief Complaint  Patient presents with  . Dressing Change    Alison Archer is a 29 y.o. female who presents to the ED today for wound check/packing removal. Pt was seen in the ED on 05/21 for abscess to L axilla and had I&D performed with packing. She was advised to return to the ED or PCP in 48 hours for packing removal. Pt states that the abscess has continued to drain a small amount. She has not started taking her antibiotics yet as she wanted the packing out prior to starting them. Pt denies any fevers, chills, increase in redness/swelling to the abscess or any other concerning symptoms.   The history is provided by the patient and medical records.       Past Medical History:  Diagnosis Date  . Asthma    exercise induced  . Seizures ([place])    febrile as a child    There are no problems to display for this patient.   Past Surgical History:  Procedure Laterality Date  . CESAREAN SECTION N/A 04/07/2013   Procedure: Primary cesarean section with delivery of baby boy at 70.  Apgars 4/7.;  Surgeon: Lavonia Drafts, MD;  Location: [facility];  Service: Obstetrics;  Laterality: N/A;  . NO PAST SURGERIES       OB History    Gravida  1   Para  1   Term  1   Preterm  0   AB  0   Living  1     SAB  0   TAB  0   Ectopic  0   Multiple  0   Live Births  1           Family History  Problem Relation Age of Onset  . Hypertension Mother   . Hypertension Father   . Asthma Sister   . Hypertension Sister   . Diabetes Maternal Grandmother     Social History   Tobacco Use  . Smoking status: Current Every Day Smoker    Packs/day: 0.25    Types: Cigarettes  Substance Use Topics  . Alcohol use: No  . Drug use: Yes    Types: Marijuana    Comment: not used recently    Home Medications Prior to Admission medications     Medication Sig Start Date End Date Taking? Authorizing Provider  doxycycline (VIBRAMYCIN) 100 MG capsule Take 1 capsule (100 mg total) by mouth 2 (two) times daily. 04/11/20   Lorin Glass, PA-C  ibuprofen (ADVIL,MOTRIN) 800 MG tablet Take 1 tablet (800 mg total) by mouth 3 (three) times daily. 07/06/18   Raylene Everts, MD  oxyCODONE-acetaminophen (PERCOCET/ROXICET) 5-325 MG tablet Take 1 tablet by mouth every 6 (six) hours as needed for severe pain. 04/11/20   Lorin Glass, PA-C    Allergies    Patient has no known allergies.  Review of Systems   Review of Systems  Constitutional: Negative for chills and fever.  Skin:       + abscess to L axilla    Physical Exam Updated Vital Signs BP 139/76 (BP Location: Right Arm)   Pulse 80   Temp 98.4 F (36.9 C) (Oral)   Resp 15   Ht 5' 5.5" (1.664 m)   Wt 102.1 kg   SpO2 99%   BMI 36.87 kg/m   Physical  Exam Vitals and nursing note reviewed.  Constitutional:      Appearance: She is not ill-appearing.  HENT:     Head: Normocephalic and atraumatic.  Eyes:     Conjunctiva/sclera: Conjunctivae normal.  Cardiovascular:     Rate and Rhythm: Normal rate and regular rhythm.  Pulmonary:     Effort: Pulmonary effort is normal.     Breath sounds: Normal breath sounds.  Musculoskeletal:     Comments: Packing in place to L axilla; small amount of purulent drainage to opening of incision site with mild TTP; no erythema or edema  Skin:    General: Skin is warm and dry.     Coloration: Skin is not jaundiced.  Neurological:     Mental Status: She is alert.     ED Results / Procedures / Treatments   Labs (all labs ordered are listed, but only abnormal results are displayed) Labs Reviewed - No data to display  EKG None  Radiology No results found.  Procedures Procedures (including critical care time)  Medications Ordered in ED Medications - No data to display  ED Course  I have reviewed the triage vital  signs and the nursing notes.  Pertinent labs & imaging results that were available during my care of the patient were reviewed by me and considered in my medical decision making (see chart for details).    MDM Rules/Calculators/A&P                      29 year old female presenting to the ED for packing removal after I&D 3 days ago to L axilla. Reports small amount of drainage has continued. No fevers or chills at home. On arrival to the ED pt is afebrile, nontachycardic, and nontachypneic. Appears to be in NAD. Has not started abx yet. On exam pt has packing in place with small amount of purulent drainage from opening. Packing removed and a larger amount of purulent drainage behind it. Pt does not want wound repacked at this time or any other procedure done which was offered given continuation of drainage. She states she has not started taking her abx but will start today. Pt strongly encouraged to return to the ED if copious drainage continues or area becomes more swollen/erythematous. She is in agreement with return precautions. Feel this is reasonable today and will discharge home with close PCP follow up.   This note was prepared using Dragon voice recognition software and may include unintentional dictation errors due to the inherent limitations of voice recognition software.  Final Clinical Impression(s) / ED Diagnoses Final diagnoses:  Visit for wound check  Abscess packing removal    Rx / DC Orders ED Discharge Orders    None       Discharge Instructions     You have opted out of having the wound repacked today.  Start taking your antibiotics as prescribed Keep wound clean and dry Follow up with your PCP in 1 week for recheck Return to the ED IMMEDIATELY for any worsening symptoms including continuation of copious drainage, abscess increasing in size, fevers > 100.4, or any other new/concerning symptoms.        Eustaquio Maize, PA-C 04/14/20 [place], [place],  DO 04/14/20 1335

## 2020-08-28 ENCOUNTER — Other Ambulatory Visit: Payer: Self-pay

## 2020-08-28 ENCOUNTER — Emergency Department (HOSPITAL_COMMUNITY)
Admission: EM | Admit: 2020-08-28 | Discharge: 2020-08-28 | Disposition: A | Discharge status: Home / Self Care | Payer: Medicaid Other | Attending: Emergency Medicine | Admitting: Emergency Medicine

## 2020-08-28 ENCOUNTER — Encounter (HOSPITAL_COMMUNITY): Payer: Self-pay | Admitting: Emergency Medicine

## 2020-08-28 DIAGNOSIS — Z20822 Contact with and (suspected) exposure to covid-19: Secondary | ICD-10-CM | POA: Diagnosis not present

## 2020-08-28 DIAGNOSIS — R0789 Other chest pain: Secondary | ICD-10-CM | POA: Diagnosis not present

## 2020-08-28 DIAGNOSIS — R059 Cough, unspecified: Secondary | ICD-10-CM | POA: Diagnosis present

## 2020-08-28 DIAGNOSIS — F17210 Nicotine dependence, cigarettes, uncomplicated: Secondary | ICD-10-CM | POA: Diagnosis not present

## 2020-08-28 DIAGNOSIS — R0602 Shortness of breath: Secondary | ICD-10-CM | POA: Insufficient documentation

## 2020-08-28 DIAGNOSIS — J45909 Unspecified asthma, uncomplicated: Secondary | ICD-10-CM | POA: Diagnosis not present

## 2020-08-28 LAB — RESP PANEL BY RT PCR (RSV, FLU A&B, COVID)
Influenza A by PCR: NEGATIVE
Influenza B by PCR: NEGATIVE
Respiratory Syncytial Virus by PCR: NEGATIVE
SARS Coronavirus 2 by RT PCR: NEGATIVE

## 2020-08-28 MED ORDER — ALBUTEROL SULFATE HFA 108 (90 BASE) MCG/ACT IN AERS
4.0000 | INHALATION_SPRAY | Freq: Once | RESPIRATORY_TRACT | Status: AC
  Administered 2020-08-28: 4 via RESPIRATORY_TRACT
  Filled 2020-08-28: qty 6.7

## 2020-08-28 NOTE — ED Triage Notes (Signed)
Pt arrives to ED with complaints of cough starting yesterday. Pt states hx asthma and feels like she is getting SOB. No inhaler, half vaccine one shot in August). Denies fever, chills, and body aches.

## 2020-08-28 NOTE — ED Provider Notes (Signed)
[facility] EMERGENC[facility] DEPARTMENT Provider Note   CSN: [id number] Arrival date & time: 08/28/20  4580     History Chief Complaint  Patient presents with  . Cough    Alison Archer is a 29 y.o. female with PMH significant for asthma who presents the ED with complaints of cough and shortness of breath.  Patient reports that she developed a mild, nonproductive cough yesterday.  Whenever she coughs, she feels as though her throat "closes" and feels consistent with her history of asthma exacerbations.  She states that she has not had an inhaler for years because her asthma but was primarily exercise-induced.  However, she is currently endorsing chest tightness, worse with cough.  She has been taking throat lozenges, with some relief.  She has not tried any other antitussive medications.  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  She received her first dose of the COVID-19 vaccination in August and plans to receive her second dose soon.  She denies any fevers, chills, body aches, congestion, headache or sore throat, history of clots or clotting disorder, chest pain, history of syncope, nausea or vomiting, or other symptoms.  HPI     Past Medical History:  Diagnosis Date  . Asthma    exercise induced  . Seizures ([place])    febrile as a child    There are no problems to display for this patient.   Past Surgical History:  Procedure Laterality Date  . CESAREAN SECTION N/A 04/07/2013   Procedure: Primary cesarean section with delivery of baby boy at 54.  Apgars 4/7.;  Surgeon: Lavonia Drafts, MD;  Location: [facility];  Service: Obstetrics;  Laterality: N/A;  . NO PAST SURGERIES       OB History    Gravida  1   Para  1   Term  1   Preterm  0   AB  0   Living  1     SAB  0   TAB  0   Ectopic  0   Multiple  0   Live Births  1           Family History  Problem  Relation Age of Onset  . Hypertension Mother   . Hypertension Father   . Asthma Sister   . Hypertension Sister   . Diabetes Maternal Grandmother     Social History   Tobacco Use  . Smoking status: Current Every Day Smoker    Packs/day: 0.25    Types: Cigarettes  Substance Use Topics  . Alcohol use: No  . Drug use: Yes    Types: Marijuana    Comment: not used recently    Home Medications Prior to Admission medications   Medication Sig Start Date End Date Taking? Authorizing Provider  doxycycline (VIBRAMYCIN) 100 MG capsule Take 1 capsule (100 mg total) by mouth 2 (two) times daily. 04/11/20   Lorin Glass, PA-C  ibuprofen (ADVIL,MOTRIN) 800 MG tablet Take 1 tablet (800 mg total) by mouth 3 (three) times daily. 07/06/18   Raylene Everts, MD  oxyCODONE-acetaminophen (PERCOCET/ROXICET) 5-325 MG tablet Take 1 tablet by mouth every 6 (six) hours as needed for severe pain. 04/11/20   Lorin Glass, PA-C    Allergies    Patient has no known allergies.  Review of Systems   Review of Systems  Constitutional: Negative for chills and fever.  HENT: Negative  for congestion, postnasal drip, rhinorrhea, sinus pressure, sinus pain and sore throat.   Respiratory: Positive for cough, shortness of breath and wheezing. Negative for choking.   Cardiovascular: Negative for chest pain, palpitations and leg swelling.  Gastrointestinal: Negative for abdominal pain and nausea.    Physical Exam Updated Vital Signs BP 133/81 (BP Location: Right Arm)   Pulse 88   Temp 99.4 F (37.4 C) (Oral)   Resp 16   Ht 5\' 5"  (1.651 m)   Wt 102.1 kg   SpO2 99%   BMI 37.44 kg/m   Physical Exam Vitals and nursing note reviewed. Exam conducted with a chaperone present.  Constitutional:      Appearance: Normal appearance.  HENT:     Head: Normocephalic and atraumatic.     Mouth/Throat:     Pharynx: Oropharynx is clear.     Comments: Patent oropharynx.  No tonsillar hypertrophy or  exudates noted. Eyes:     General: No scleral icterus.    Conjunctiva/sclera: Conjunctivae normal.  Cardiovascular:     Rate and Rhythm: Normal rate and regular rhythm.     Pulses: Normal pulses.     Heart sounds: Normal heart sounds.  Pulmonary:     Comments: No significant increased work of breathing.  No tachypnea.  No audible wheezing.  Very mild wheezing auscultated on exam.  No inspiratory stridor or rales noted.  Breath sounds intact bilaterally with symmetric chest rise. Abdominal:     General: Abdomen is flat. There is no distension.     Palpations: Abdomen is soft.     Tenderness: There is no abdominal tenderness.  Musculoskeletal:     Cervical back: Normal range of motion.     Right lower leg: No edema.     Left lower leg: No edema.  Skin:    General: Skin is dry.     Capillary Refill: Capillary refill takes less than 2 seconds.  Neurological:     Mental Status: She is alert and oriented to person, place, and time.     GCS: GCS eye subscore is 4. GCS verbal subscore is 5. GCS motor subscore is 6.  Psychiatric:        Mood and Affect: Mood normal.        Behavior: Behavior normal.        Thought Content: Thought content normal.     ED Results / Procedures / Treatments   Labs (all labs ordered are listed, but only abnormal results are displayed) Labs Reviewed  RESP PANEL BY RT PCR (RSV, FLU A&B, COVID)    EKG None  Radiology No results found.  Procedures Procedures (including critical care time)  Medications Ordered in ED Medications  albuterol (VENTOLIN HFA) 108 (90 Base) MCG/ACT inhaler 4 puff (4 puffs Inhalation Given 08/28/20 0934)    ED Course  I have reviewed the triage vital signs and the nursing notes.  Pertinent labs & imaging results that were available during my care of the patient were reviewed by me and considered in my medical decision making (see chart for details).    MDM Rules/Calculators/A&P                          Patient is  well-appearing on my examination with normal vital signs.  She states that her mild nonproductive cough symptoms began yesterday which has precipitated her shortness of breath consistent with her prior asthma exacerbations.  She has been without inhaler for a  couple of years as her asthma had previously been exercise-induced.  However, she is endorsing chest/throat "tightness" and wheezing at home.  No audible wheezing on my exam and very mild on auscultation.  Do not feel as though steroids are warranted.  Instead, will treat with albuterol x4 here in the ED and then reassess.  Plan for respiratory panel testing.  She is exposed to little children at her place of employment, will include RSV in her respiratory panel.  Given brevity of her illness, do not feel as though laboratory work-up or imaging is warranted.    On reexamination, patient feels much improved after 4 puffs albuterol.  She will go home with the albuterol inhaler.  She is followed by [facility], encouraged her to follow-up there regarding today's encounter and for ongoing evaluation/management.  I am also encouraging over-the-counter antitussive medication such as Delsym or Mucinex as needed for cough relief.  Encouraging her to check her temperature regularly and take Tylenol/ibuprofen as needed for fever control.  Patient to maintain isolation precautions pending results of her testing.  Strict ED return precautions discussed.  Patient voiced understanding prescription plan.   Final Clinical Impression(s) / ED Diagnoses Final diagnoses:  Cough    Rx / DC Orders ED Discharge Orders    None       Corena Herter, PA-C 08/28/20 1010    Lucrezia Starch, MD 08/30/20 1636

## 2020-08-28 NOTE — Discharge Instructions (Addendum)
You have been tested for COVID-19, influenza a and B, and RSV.  Please maintain isolation precautions pending results of your testing.  I recommend Delsym, Mucinex, or other over-the-counter antitussive medications for relief of your cough symptoms.  Please check your temperature regularly and take Tylenol or ibuprofen as needed for fever control.  Do not take ibuprofen if you are pregnant or breast-feeding.  Please follow-up with your primary care provider at [facility].  Return to the ED or seek immediate medical attention should you experience any new or worsening symptoms.

## 2021-02-04 ENCOUNTER — Encounter (HOSPITAL_COMMUNITY): Payer: Self-pay

## 2021-02-04 ENCOUNTER — Other Ambulatory Visit: Payer: Self-pay

## 2021-02-04 ENCOUNTER — Ambulatory Visit (HOSPITAL_COMMUNITY)
Admission: EM | Admit: 2021-02-04 | Discharge: 2021-02-04 | Disposition: A | Discharge status: Home / Self Care | Payer: Medicaid Other | Attending: Physician Assistant | Admitting: Physician Assistant

## 2021-02-04 DIAGNOSIS — K0889 Other specified disorders of teeth and supporting structures: Secondary | ICD-10-CM | POA: Diagnosis not present

## 2021-02-04 MED ORDER — CLINDAMYCIN HCL 300 MG PO CAPS
300.0000 mg | ORAL_CAPSULE | Freq: Three times a day (TID) | ORAL | 0 refills | Status: AC
Start: 2021-02-04 — End: 2021-02-14

## 2021-02-04 NOTE — ED Triage Notes (Signed)
Pt in with c/o dental pain and facial swelling that started on Saturday  Pt has been taking tylenol with no relief

## 2021-02-04 NOTE — ED Provider Notes (Signed)
[facility]    CSN: [id number] Arrival date & time: 02/04/21  1115      History   Chief Complaint Chief Complaint  Patient presents with  . Dental Pain    HPI Alison Archer is a 30 y.o. female.   The history is provided by the patient. No language interpreter was used.  Dental Pain Location:  Lower Lower teeth location:  18/LL 2nd molar Quality:  Aching Severity:  Moderate Onset quality:  Gradual Timing:  Constant Progression:  Worsening Chronicity:  New Relieved by:  Nothing Worsened by:  Nothing Ineffective treatments:  None tried Pt reports she is suppose to have a crown placed on a tooth.   Past Medical History:  Diagnosis Date  . Asthma    exercise induced  . Seizures ([place])    febrile as a child    There are no problems to display for this patient.   Past Surgical History:  Procedure Laterality Date  . CESAREAN SECTION N/A 04/07/2013   Procedure: Primary cesarean section with delivery of baby boy at 40.  Apgars 4/7.;  Surgeon: Lavonia Drafts, MD;  Location: [facility];  Service: Obstetrics;  Laterality: N/A;  . NO PAST SURGERIES      OB History    Gravida  1   Para  1   Term  1   Preterm  0   AB  0   Living  1     SAB  0   IAB  0   Ectopic  0   Multiple  0   Live Births  1            Home Medications    Prior to Admission medications   Medication Sig Start Date End Date Taking? Authorizing Provider  clindamycin (CLEOCIN) 300 MG capsule Take 1 capsule (300 mg total) by mouth 3 (three) times daily for 10 days. 02/04/21 02/14/21 Yes Fransico Meadow, PA-C  doxycycline (VIBRAMYCIN) 100 MG capsule Take 1 capsule (100 mg total) by mouth 2 (two) times daily. 04/11/20   Lorin Glass, PA-C  ibuprofen (ADVIL,MOTRIN) 800 MG tablet Take 1 tablet (800 mg total) by mouth 3 (three) times daily. 07/06/18   Raylene Everts, MD  oxyCODONE-acetaminophen (PERCOCET/ROXICET) 5-325 MG tablet Take 1 tablet by mouth every 6  (six) hours as needed for severe pain. 04/11/20   Lorin Glass, PA-C    Family History Family History  Problem Relation Age of Onset  . Hypertension Mother   . Hypertension Father   . Asthma Sister   . Hypertension Sister   . Diabetes Maternal Grandmother     Social History Social History   Tobacco Use  . Smoking status: Current Every Day Smoker    Packs/day: 0.25    Types: Cigarettes  . Smokeless tobacco: Never Used  Substance Use Topics  . Alcohol use: No  . Drug use: Yes    Types: Marijuana    Comment: not used recently     Allergies   Patient has no known allergies.   Review of Systems Review of Systems  All other systems reviewed and are negative.    Physical Exam Triage Vital Signs ED Triage Vitals  Enc Vitals Group     BP 02/04/21 1203 130/72     Pulse Rate 02/04/21 1202 90     Resp 02/04/21 1202 19     Temp 02/04/21 1202 98.8 F (37.1 C)     Temp src --  SpO2 02/04/21 1202 100 %     Weight --      Height --      Head Circumference --      Peak Flow --      Pain Score 02/04/21 1201 8     Pain Loc --      Pain Edu? --      Excl. in [place]? --    No data found.  Updated Vital Signs BP 130/72   Pulse 90   Temp 98.8 F (37.1 C)   Resp 19   LMP 01/13/2021 (Approximate)   SpO2 100%   Breastfeeding No   Visual Acuity Right Eye Distance:   Left Eye Distance:   Bilateral Distance:    Right Eye Near:   Left Eye Near:    Bilateral Near:     Physical Exam Vitals reviewed.  Constitutional:      Appearance: Normal appearance.  HENT:     Mouth/Throat:     Comments: Swollen left lower gumline  cavity Cardiovascular:     Rate and Rhythm: Normal rate.  Pulmonary:     Effort: Pulmonary effort is normal.  Musculoskeletal:        General: Normal range of motion.  Skin:    General: Skin is warm.  Neurological:     General: No focal deficit present.     Mental Status: She is alert.  Psychiatric:        Mood and Affect: Mood  normal.      UC Treatments / Results  Labs (all labs ordered are listed, but only abnormal results are displayed) Labs Reviewed - No data to display  EKG   Radiology No results found.  Procedures Procedures (including critical care time)  Medications Ordered in UC Medications - No data to display  Initial Impression / Assessment and Plan / UC Course  I have reviewed the triage vital signs and the nursing notes.  Pertinent labs & imaging results that were available during my care of the patient were reviewed by me and considered in my medical decision making (see chart for details).     MDM:  Pt advised to call her dentist to schedule to be seen.  Final Clinical Impressions(s) / UC Diagnoses   Final diagnoses:  Tooth pain     Discharge Instructions     Call your dentist to be seen for evaluation    ED Prescriptions    Medication Sig Dispense Auth. Provider   clindamycin (CLEOCIN) 300 MG capsule Take 1 capsule (300 mg total) by mouth 3 (three) times daily for 10 days. 30 capsule Fransico Meadow, [place]     PDMP not reviewed this encounter.  An After Visit Summary was printed and given to the patient.    Fransico Meadow, [place] 02/04/21 1556

## 2021-02-04 NOTE — Discharge Instructions (Signed)
Call your dentist to be seen for evaluation

## 2021-03-22 ENCOUNTER — Ambulatory Visit (HOSPITAL_COMMUNITY)
Admission: EM | Admit: 2021-03-22 | Discharge: 2021-03-22 | Disposition: A | Discharge status: Home / Self Care | Payer: Medicaid Other | Attending: Physician Assistant | Admitting: Physician Assistant

## 2021-03-22 ENCOUNTER — Other Ambulatory Visit: Payer: Self-pay

## 2021-03-22 ENCOUNTER — Ambulatory Visit (HOSPITAL_COMMUNITY): Payer: Medicaid Other

## 2021-03-22 ENCOUNTER — Ambulatory Visit (INDEPENDENT_AMBULATORY_CARE_PROVIDER_SITE_OTHER): Payer: Medicaid Other

## 2021-03-22 ENCOUNTER — Encounter (HOSPITAL_COMMUNITY): Payer: Self-pay

## 2021-03-22 DIAGNOSIS — M25551 Pain in right hip: Secondary | ICD-10-CM

## 2021-03-22 DIAGNOSIS — M79604 Pain in right leg: Secondary | ICD-10-CM | POA: Diagnosis not present

## 2021-03-22 MED ORDER — KETOROLAC TROMETHAMINE 30 MG/ML IJ SOLN
30.0000 mg | Freq: Once | INTRAMUSCULAR | Status: AC
  Administered 2021-03-22: 30 mg via INTRAMUSCULAR

## 2021-03-22 MED ORDER — CYCLOBENZAPRINE HCL 5 MG PO TABS: 5.0000 mg | ORAL_TABLET | Freq: Three times a day (TID) | ORAL | 0 refills | Status: DC | PRN

## 2021-03-22 MED ORDER — KETOROLAC TROMETHAMINE 30 MG/ML IJ SOLN
INTRAMUSCULAR | Status: AC
  Filled 2021-03-22: qty 1

## 2021-03-22 NOTE — ED Triage Notes (Signed)
Pt present right leg pain, the pain starts at the top of her thigh and shoot down to her foot. Pt is unable to stand a long period of time, pt states it hurts to sit down. It just constant pain. The pain came out of nowhere on Tuesday.

## 2021-03-22 NOTE — Discharge Instructions (Signed)
Take Flexeril as needed Take Ibuprofen as needed for pain Follow up with orthopedics

## 2021-03-22 NOTE — ED Provider Notes (Signed)
[place]    CSN: [id number] Arrival date & time: 03/22/21  1012      History   Chief Complaint Chief Complaint  Patient presents with  . Leg Pain    HPI Alison Archer is a 30 y.o. female.   Pt complains of right groin pain with radiation to the knee that started several days ago.  Denies injury, trauma.  She denies numbness or tingling.  Pain is worse with ambulation and standing.  She has tried tylenol with minimal relief.  No previous problems with this hip.       Past Medical History:  Diagnosis Date  . Asthma    exercise induced  . Seizures ([place])    febrile as a child    There are no problems to display for this patient.   Past Surgical History:  Procedure Laterality Date  . CESAREAN SECTION N/A 04/07/2013   Procedure: Primary cesarean section with delivery of baby boy at 22.  Apgars 4/7.;  Surgeon: Lavonia Drafts, MD;  Location: [facility];  Service: Obstetrics;  Laterality: N/A;  . NO PAST SURGERIES      OB History    Gravida  1   Para  1   Term  1   Preterm  0   AB  0   Living  1     SAB  0   IAB  0   Ectopic  0   Multiple  0   Live Births  1            Home Medications    Prior to Admission medications   Medication Sig Start Date End Date Taking? Authorizing Provider  doxycycline (VIBRAMYCIN) 100 MG capsule Take 1 capsule (100 mg total) by mouth 2 (two) times daily. 04/11/20   Lorin Glass, PA-C  ibuprofen (ADVIL,MOTRIN) 800 MG tablet Take 1 tablet (800 mg total) by mouth 3 (three) times daily. 07/06/18   Raylene Everts, MD  oxyCODONE-acetaminophen (PERCOCET/ROXICET) 5-325 MG tablet Take 1 tablet by mouth every 6 (six) hours as needed for severe pain. 04/11/20   Lorin Glass, PA-C    Family History Family History  Problem Relation Age of Onset  . Hypertension Mother   . Hypertension Father   . Asthma Sister   . Hypertension Sister   . Diabetes Maternal Grandmother     Social  History Social History   Tobacco Use  . Smoking status: Current Every Day Smoker    Packs/day: 0.25    Types: Cigarettes  . Smokeless tobacco: Never Used  Substance Use Topics  . Alcohol use: No  . Drug use: Yes    Types: Marijuana    Comment: not used recently     Allergies   Patient has no known allergies.   Review of Systems Review of Systems  Constitutional: Negative for chills and fever.  HENT: Negative for ear pain and sore throat.   Eyes: Negative for pain and visual disturbance.  Respiratory: Negative for cough and shortness of breath.   Cardiovascular: Negative for chest pain and palpitations.  Gastrointestinal: Negative for abdominal pain and vomiting.  Genitourinary: Negative for dysuria and hematuria.  Musculoskeletal: Positive for arthralgias (right hip pain). Negative for back pain.  Skin: Negative for color change and rash.  Neurological: Negative for seizures, syncope and numbness.  All other systems reviewed and are negative.    Physical Exam Triage Vital Signs ED Triage Vitals  Enc Vitals Group  BP 03/22/21 1050 129/71     Pulse Rate 03/22/21 1050 84     Resp 03/22/21 1050 18     Temp 03/22/21 1050 98.6 F (37 C)     Temp Source 03/22/21 1050 Oral     SpO2 03/22/21 1050 100 %     Weight --      Height --      Head Circumference --      Peak Flow --      Pain Score 03/22/21 1049 10     Pain Loc --      Pain Edu? --      Excl. in [place]? --    No data found.  Updated Vital Signs BP 129/71 (BP Location: Left Arm)   Pulse 84   Temp 98.6 F (37 C) (Oral)   Resp 18   SpO2 100%   Visual Acuity Right Eye Distance:   Left Eye Distance:   Bilateral Distance:    Right Eye Near:   Left Eye Near:    Bilateral Near:     Physical Exam Vitals and nursing note reviewed.  Constitutional:      General: She is not in acute distress.    Appearance: She is well-developed.  HENT:     Head: Normocephalic and atraumatic.  Eyes:      Conjunctiva/sclera: Conjunctivae normal.  Cardiovascular:     Rate and Rhythm: Normal rate and regular rhythm.     Heart sounds: No murmur heard.   Pulmonary:     Effort: Pulmonary effort is normal. No respiratory distress.     Breath sounds: Normal breath sounds.  Abdominal:     Palpations: Abdomen is soft.     Tenderness: There is no abdominal tenderness.  Musculoskeletal:     Cervical back: Neck supple.     Right hip: No tenderness or bony tenderness. Normal range of motion. Normal strength.     Left hip: No tenderness or bony tenderness. Normal range of motion. Normal strength.     Comments: Pain with internal and external rotation of right hip  Skin:    General: Skin is warm and dry.  Neurological:     Mental Status: She is alert.      UC Treatments / Results  Labs (all labs ordered are listed, but only abnormal results are displayed) Labs Reviewed - No data to display  EKG   Radiology No results found.  Procedures Procedures (including critical care time)  Medications Ordered in UC Medications  ketorolac (TORADOL) 30 MG/ML injection 30 mg (has no administration in time range)    Initial Impression / Assessment and Plan / UC Course  I have reviewed the triage vital signs and the nursing notes.  Pertinent labs & imaging results that were available during my care of the patient were reviewed by me and considered in my medical decision making (see chart for details).     Toradol given in clinic today.  Xrays negative for fracture.  Flexeril prescribed. She will continue NSAIDS and follow up with ortho.   Final Clinical Impressions(s) / UC Diagnoses   Final diagnoses:  None   Discharge Instructions   None    ED Prescriptions    None     PDMP not reviewed this encounter.   Konrad Felix, PA-C 03/22/21 1145

## 2021-04-02 ENCOUNTER — Encounter (HOSPITAL_COMMUNITY): Payer: Self-pay | Admitting: Emergency Medicine

## 2021-04-02 ENCOUNTER — Other Ambulatory Visit: Payer: Self-pay

## 2021-04-02 ENCOUNTER — Ambulatory Visit (HOSPITAL_COMMUNITY)
Admission: EM | Admit: 2021-04-02 | Discharge: 2021-04-02 | Disposition: A | Discharge status: Home / Self Care | Payer: Medicaid Other | Attending: Physician Assistant | Admitting: Physician Assistant

## 2021-04-02 DIAGNOSIS — K0889 Other specified disorders of teeth and supporting structures: Secondary | ICD-10-CM | POA: Diagnosis not present

## 2021-04-02 DIAGNOSIS — K047 Periapical abscess without sinus: Secondary | ICD-10-CM

## 2021-04-02 DIAGNOSIS — T3695XA Adverse effect of unspecified systemic antibiotic, initial encounter: Secondary | ICD-10-CM

## 2021-04-02 DIAGNOSIS — R220 Localized swelling, mass and lump, head: Secondary | ICD-10-CM | POA: Diagnosis not present

## 2021-04-02 DIAGNOSIS — B379 Candidiasis, unspecified: Secondary | ICD-10-CM

## 2021-04-02 MED ORDER — FLUCONAZOLE 150 MG PO TABS: 150.0000 mg | ORAL_TABLET | Freq: Once | ORAL | 0 refills | Status: AC

## 2021-04-02 MED ORDER — AMOXICILLIN-POT CLAVULANATE 875-125 MG PO TABS: 1.0000 | ORAL_TABLET | Freq: Two times a day (BID) | ORAL | 0 refills | Status: DC

## 2021-04-02 NOTE — ED Provider Notes (Addendum)
[place]    CSN: [id number] Arrival date & time: 04/02/21  8099      History   Chief Complaint Chief Complaint  Patient presents with  . Dental Pain  . Facial Pain    HPI Alison Archer is a 30 y.o. female.   Patient presents today with a several day history of right lower dental pain.  Overnight pain has worsened and she now has swelling over affected area.  She has known history of broken/fractured tooth in this area that has become infected in the past.  Reports current symptoms are similar to previous episodes of this condition.  Pain is rated 9 on a 0-10 pain scale, localized to right lower jaw without radiation, described as throbbing, worse with mastication, no alleviating factors identified.  She denies any difficulty swallowing, shortness of breath, muffled voice, throat/tongue swelling.  She denies any recent antibiotic use.  Denies associated fever, nausea, vomiting, headache, dizziness, chest pain, shortness of breath.     Past Medical History:  Diagnosis Date  . Asthma    exercise induced  . Seizures ([place])    febrile as a child    There are no problems to display for this patient.   Past Surgical History:  Procedure Laterality Date  . CESAREAN SECTION N/A 04/07/2013   Procedure: Primary cesarean section with delivery of baby boy at 46.  Apgars 4/7.;  Surgeon: Lavonia Drafts, MD;  Location: [facility];  Service: Obstetrics;  Laterality: N/A;  . NO PAST SURGERIES      OB History    Gravida  1   Para  1   Term  1   Preterm  0   AB  0   Living  1     SAB  0   IAB  0   Ectopic  0   Multiple  0   Live Births  1            Home Medications    Prior to Admission medications   Medication Sig Start Date End Date Taking? Authorizing Provider  amoxicillin-clavulanate (AUGMENTIN) 875-125 MG tablet Take 1 tablet by mouth every 12 (twelve) hours. 04/02/21  Yes Kasch Borquez K, PA-C  fluconazole (DIFLUCAN) 150 MG tablet Take  1 tablet (150 mg total) by mouth once for 1 dose. 04/02/21 04/02/21 Yes Mahaila Tischer, Derry Skill, PA-C  cyclobenzaprine (FLEXERIL) 5 MG tablet Take 1 tablet (5 mg total) by mouth 3 (three) times daily as needed for muscle spasms. 03/22/21   Konrad Felix, PA-C  ibuprofen (ADVIL,MOTRIN) 800 MG tablet Take 1 tablet (800 mg total) by mouth 3 (three) times daily. 07/06/18   Raylene Everts, MD    Family History Family History  Problem Relation Age of Onset  . Hypertension Mother   . Hypertension Father   . Asthma Sister   . Hypertension Sister   . Diabetes Maternal Grandmother     Social History Social History   Tobacco Use  . Smoking status: Current Every Day Smoker    Packs/day: 0.25    Types: Cigarettes  . Smokeless tobacco: Never Used  Substance Use Topics  . Alcohol use: No  . Drug use: Yes    Types: Marijuana    Comment: not used recently     Allergies   Patient has no known allergies.   Review of Systems Review of Systems  Constitutional: Negative for activity change, appetite change, fatigue and fever.  HENT: Positive for dental problem and facial swelling.  Negative for congestion, drooling, sinus pressure, sneezing and sore throat.   Respiratory: Negative for cough and shortness of breath.   Cardiovascular: Negative for chest pain.  Gastrointestinal: Negative for abdominal pain, diarrhea, nausea and vomiting.  Musculoskeletal: Negative for arthralgias and myalgias.  Neurological: Negative for dizziness, light-headedness and headaches.     Physical Exam Triage Vital Signs ED Triage Vitals  Enc Vitals Group     BP 04/02/21 1005 125/76     Pulse Rate 04/02/21 1005 79     Resp 04/02/21 1005 16     Temp 04/02/21 1005 99.3 F (37.4 C)     Temp Source 04/02/21 1005 Oral     SpO2 04/02/21 1005 100 %     Weight --      Height --      Head Circumference --      Peak Flow --      Pain Score 04/02/21 1002 9     Pain Loc --      Pain Edu? --      Excl. in [place]? --     No data found.  Updated Vital Signs BP 125/76 (BP Location: Right Arm)   Pulse 79   Temp 99.3 F (37.4 C) (Oral)   Resp 16   LMP 03/13/2021   SpO2 100%   Visual Acuity Right Eye Distance:   Left Eye Distance:   Bilateral Distance:    Right Eye Near:   Left Eye Near:    Bilateral Near:     Physical Exam Vitals reviewed.  Constitutional:      General: She is awake. She is not in acute distress.    Appearance: Normal appearance. She is not ill-appearing.     Comments: Very pleasant female appears stated age in no acute distress  HENT:     Head: Normocephalic and atraumatic.     Right Ear: External ear normal.     Left Ear: External ear normal.     Nose: Nose normal.     Mouth/Throat:     Dentition: Abnormal dentition. Dental tenderness and gingival swelling present.     Pharynx: Uvula midline. No oropharyngeal exudate or posterior oropharyngeal erythema.     Tonsils: No tonsillar exudate or tonsillar abscesses. 1+ on the right. 1+ on the left.      Comments: No evidence Ludwig angina Cardiovascular:     Rate and Rhythm: Normal rate and regular rhythm.     Heart sounds: No murmur heard.   Pulmonary:     Effort: Pulmonary effort is normal.     Breath sounds: Normal breath sounds. No wheezing, rhonchi or rales.     Comments: Clear to auscultation bilaterally Lymphadenopathy:     Head:     Right side of head: No submental, submandibular or tonsillar adenopathy.     Left side of head: No submental, submandibular or tonsillar adenopathy.     Cervical: No cervical adenopathy.  Psychiatric:        Behavior: Behavior is cooperative.      UC Treatments / Results  Labs (all labs ordered are listed, but only abnormal results are displayed) Labs Reviewed - No data to display  EKG   Radiology No results found.  Procedures Procedures (including critical care time)  Medications Ordered in UC Medications - No data to display  Initial Impression / Assessment  and Plan / UC Course  I have reviewed the triage vital signs and the nursing notes.  Pertinent labs & imaging results  that were available during my care of the patient were reviewed by me and considered in my medical decision making (see chart for details).     Dental infection noted on exam.  Patient started on Augmentin.  She was given 1 dose of Diflucan in case she develops antibiotic induced yeast infection and instructed to take this at onset of symptoms.  She can use over-the-counter analgesics for pain relief.  She is scheduled to see a dentist on Monday (04/06/2021 no external encouraged to keep this appointment as symptoms will likely recur until tooth is fixed.  Discussed alarm symptoms that warrant emergent evaluation.  Strict return precautions given to which patient expressed understanding.  Final Clinical Impressions(s) / UC Diagnoses   Final diagnoses:  Dental infection  Dentalgia  Jaw swelling  Antibiotic-induced yeast infection     Discharge Instructions     Take Augmentin (antibiotic) twice daily for 7 days.  You can alternate Tylenol and ibuprofen for pain relief.  Is importantly follow-up with dentist as scheduled as this will likely continue to recur until tooth is fixed.  I have sent in a medication to treat yeast infection which you should take should he develop the symptoms.  If you have any worsening symptoms including trouble swallowing, trouble speaking, shortness of breath you need to go to the emergency room.    ED Prescriptions    Medication Sig Dispense Auth. Provider   amoxicillin-clavulanate (AUGMENTIN) 875-125 MG tablet Take 1 tablet by mouth every 12 (twelve) hours. 14 tablet Reiner Loewen K, PA-C   fluconazole (DIFLUCAN) 150 MG tablet Take 1 tablet (150 mg total) by mouth once for 1 dose. 1 tablet Delmon Andrada, Derry Skill, PA-C     PDMP not reviewed this encounter.   Terrilee Croak, PA-C 04/02/21 1028    Clifton Safley K, PA-C 04/02/21 1029

## 2021-04-02 NOTE — Discharge Instructions (Signed)
Take Augmentin (antibiotic) twice daily for 7 days.  You can alternate Tylenol and ibuprofen for pain relief.  Is importantly follow-up with dentist as scheduled as this will likely continue to recur until tooth is fixed.  I have sent in a medication to treat yeast infection which you should take should he develop the symptoms.  If you have any worsening symptoms including trouble swallowing, trouble speaking, shortness of breath you need to go to the emergency room.

## 2021-04-02 NOTE — ED Triage Notes (Signed)
Pt c/o of right lower mouth pain and swelling. Denies fever.

## 2021-07-12 ENCOUNTER — Other Ambulatory Visit: Payer: Self-pay

## 2021-07-12 ENCOUNTER — Encounter (HOSPITAL_COMMUNITY): Payer: Self-pay | Admitting: Obstetrics and Gynecology

## 2021-07-12 ENCOUNTER — Inpatient Hospital Stay (HOSPITAL_COMMUNITY)
Admission: AD | Admit: 2021-07-12 | Discharge: 2021-07-12 | Disposition: A | Discharge status: Home / Self Care | Payer: Medicaid Other | Attending: Obstetrics and Gynecology | Admitting: Obstetrics and Gynecology

## 2021-07-12 DIAGNOSIS — Z3A01 Less than 8 weeks gestation of pregnancy: Secondary | ICD-10-CM | POA: Diagnosis not present

## 2021-07-12 DIAGNOSIS — R12 Heartburn: Secondary | ICD-10-CM | POA: Diagnosis not present

## 2021-07-12 DIAGNOSIS — O26891 Other specified pregnancy related conditions, first trimester: Secondary | ICD-10-CM | POA: Diagnosis present

## 2021-07-12 DIAGNOSIS — Z3201 Encounter for pregnancy test, result positive: Secondary | ICD-10-CM | POA: Diagnosis not present

## 2021-07-12 LAB — POCT PREGNANCY, URINE: Preg Test, Ur: POSITIVE — AB

## 2021-07-12 NOTE — MAU Note (Signed)
Alison Archer is a 30 y.o. at 18w6dhere in MAU reporting: states had some bleeding a few days ago when she thought her period was going to started but then the bleeding has stopped and has not seen any for several days. Has had some heartburn. No abdominal pain. States she is here for confirmation and to see how far along she is.   LMP: 06/08/21  Onset of complaint: ongoing  Pain score: 0/10  Vitals:   07/12/21 1231  BP: 126/73  Pulse: 89  Resp: 16  Temp: 98.5 F (36.9 C)  SpO2: 99%     Lab orders placed from triage: upt

## 2021-07-12 NOTE — MAU Provider Note (Signed)
[name] is a 30 y.o. G2P1001 '@[redacted]w[redacted]d'$  by LMP who presents to MAU today for pregnancy verification. She feels well other than occasional heartburn. Denies VB and abd pain. Denies N/V.   ROS: +heartburn No VB No abd pain  O BP 126/73 (BP Location: Right Arm)   Pulse 89   Temp 98.5 F (36.9 C) (Oral)   Resp 16   LMP 06/08/2021   SpO2 99% Comment: room air Physical Exam Vitals and nursing note reviewed.  Constitutional:      General: She is not in acute distress.    Appearance: Normal appearance.  HENT:     Head: Normocephalic and atraumatic.  Cardiovascular:     Rate and Rhythm: Normal rate.  Pulmonary:     Effort: Pulmonary effort is normal. No respiratory distress.  Musculoskeletal:        General: Normal range of motion.     Cervical back: Normal range of motion.  Skin:    General: Skin is warm and dry.  Neurological:     General: No focal deficit present.     Mental Status: She is alert and oriented to person, place, and time.  Psychiatric:        Mood and Affect: Mood normal.        Behavior: Behavior normal.   Results for orders placed or performed during the hospital encounter of 07/12/21 (from the past 24 hour(s))  Pregnancy, urine POC     Status: Abnormal   Collection Time: 07/12/21 12:20 PM  Result Value Ref Range   Preg Test, Ur POSITIVE (A) NEGATIVE   MDM: No concerning signs but UPT done inadvertently. Discussed measures to treat heartburn and safe meds list provided. Stable for discharge home.   A 1. Heartburn during pregnancy in first trimester   2. Positive pregnancy test    P Discharge from MAU in stable condition Warning signs for worsening condition that would warrant emergency follow-up discussed Pregnancy verification letter provided Patient may return to MAU as needed for pregnancy related complaints Follow up at [facility] to start care Continue PNV daily  Allergies as of 07/12/2021   No Known Allergies      Medication List      STOP taking these medications    amoxicillin-clavulanate 875-125 MG tablet Commonly known as: AUGMENTIN   cyclobenzaprine 5 MG tablet Commonly known as: FLEXERIL   ibuprofen 800 MG tablet Commonly known as: Arelia Longest, CNM 07/12/2021 12:48 PM

## 2021-07-12 NOTE — Discharge Instructions (Addendum)
Safe Medications in Pregnancy    Acne: Benzoyl Peroxide Salicylic Acid  Backache/Headache: Tylenol: 2 regular strength every 4 hours OR              2 Extra strength every 6 hours  Colds/Coughs/Allergies: Benadryl (alcohol free) 25 mg every 6 hours as needed Breath right strips Claritin Cepacol throat lozenges Chloraseptic throat spray Cold-Eeze- up to three times per day Cough drops, alcohol free Flonase (by prescription only) Guaifenesin Mucinex Robitussin DM (plain only, alcohol free) Saline nasal spray/drops Sudafed (pseudoephedrine) & Actifed ** use only after [redacted] weeks gestation and if you do not have high blood pressure Tylenol Vicks Vaporub Zinc lozenges Zyrtec   Constipation: Colace Ducolax suppositories Fleet enema Glycerin suppositories Metamucil Milk of magnesia Miralax Senokot Smooth move tea  Diarrhea: Kaopectate Imodium A-D  *NO pepto Bismol  Hemorrhoids: Anusol Anusol HC Preparation H Tucks  Indigestion/Heartburn: Tums Maalox Mylanta Zantac  Pepcid  Insomnia: Benadryl (alcohol free) '25mg'$  every 6 hours as needed Tylenol PM Unisom, no Gelcaps  Leg Cramps: Tums MagGel  Nausea/Vomiting:  Bonine Dramamine Emetrol Ginger extract Sea bands Meclizine  Nausea medication to take during pregnancy:  Unisom (doxylamine succinate 25 mg tablets) Take one tablet daily at bedtime. If symptoms are not adequately controlled, the dose can be increased to a maximum recommended dose of two tablets daily (1/2 tablet in the morning, 1/2 tablet mid-afternoon and one at bedtime). Vitamin B6 '100mg'$  tablets. Take one tablet twice a day (up to 200 mg per day).  Skin Rashes: Aveeno products Benadryl cream or '25mg'$  every 6 hours as needed Calamine Lotion 1% cortisone cream  Yeast infection: Gyne-lotrimin 7 Monistat 7   **If taking multiple medications, please check labels to avoid duplicating the same active ingredients **take  medication as directed on the label ** Do not exceed 4000 mg of tylenol in 24 hours **Do not take medications that contain aspirin or ibuprofen

## 2021-07-31 ENCOUNTER — Other Ambulatory Visit: Payer: Self-pay

## 2021-07-31 ENCOUNTER — Inpatient Hospital Stay (HOSPITAL_COMMUNITY)
Admission: AD | Admit: 2021-07-31 | Discharge: 2021-07-31 | Disposition: A | Discharge status: Home / Self Care | Payer: Medicaid Other | Attending: Obstetrics and Gynecology | Admitting: Obstetrics and Gynecology

## 2021-07-31 ENCOUNTER — Encounter (HOSPITAL_COMMUNITY): Payer: Self-pay | Admitting: Obstetrics and Gynecology

## 2021-07-31 DIAGNOSIS — O219 Vomiting of pregnancy, unspecified: Secondary | ICD-10-CM | POA: Diagnosis present

## 2021-07-31 DIAGNOSIS — Z3A01 Less than 8 weeks gestation of pregnancy: Secondary | ICD-10-CM | POA: Insufficient documentation

## 2021-07-31 LAB — URINALYSIS, ROUTINE W REFLEX MICROSCOPIC
Bilirubin Urine: NEGATIVE
Glucose, UA: NEGATIVE mg/dL
Hgb urine dipstick: NEGATIVE
Ketones, ur: NEGATIVE mg/dL
Leukocytes,Ua: NEGATIVE
Nitrite: NEGATIVE
Protein, ur: NEGATIVE mg/dL
Specific Gravity, Urine: 1.02 (ref 1.005–1.030)
pH: 6.5 (ref 5.0–8.0)

## 2021-07-31 MED ORDER — DOXYLAMINE-PYRIDOXINE 10-10 MG PO TBEC: 2.0000 | DELAYED_RELEASE_TABLET | Freq: Every day | ORAL | 1 refills | Status: DC

## 2021-07-31 NOTE — MAU Provider Note (Signed)
History     CSN: [id number]  Arrival date and time: 07/31/21 1030   Event Date/Time   First Provider Initiated Contact with Patient 07/31/21 1237      Chief Complaint  Patient presents with   Abdominal Pain   Emesis   Ms. Alison Archer is a 30 y.o. G2P1001 at 72w4dwho presents to MAU for morning sickness medicine and an [place] check on the baby.  Patient states she is having nausea and vomiting throughout the day, but is [place] to eat regularly. Patient states she is vomiting about 2-4 times daily, but it depends on the day. Patient states some days are better than others. Patient states the nausea will last all day. Patient states she was [place] to eat dinner last night and had a steak and cheese sandwich and pink lemonade and water to drink. Patient states she was [place] to eat breakfast today, which was an egg and cheese biscuit. Patient states she last ate today around 9AM. Patient states she is bothered by both the nausea and the vomiting, but states that she feels if her nausea was well-controlled, she would not vomit.  Pt denies VB, LOF, ctx, decreased FM, vaginal discharge/odor/itching. Pt denies abdominal pain, constipation, diarrhea, or urinary problems. Pt denies fever, chills, fatigue, sweating or changes in appetite. Pt denies SOB or chest pain. Pt denies dizziness, HA, light-headedness, weakness.  Problems this pregnancy include: pt has not yet been seen. Allergies? NKDA Current medications/supplements? none Prenatal care provider? [facility], next appt 08/31/2021   OB History     Gravida  2   Para  1   Term  1   Preterm  0   AB  0   Living  1      SAB  0   IAB  0   Ectopic  0   Multiple  0   Live Births  1           Past Medical History:  Diagnosis Date   Asthma    exercise induced   Seizures ([place]    febrile as a child    Past Surgical History:  Procedure Laterality Date   CESAREAN SECTION N/A 04/07/2013   Procedure: Primary  cesarean section with delivery of baby boy at 128  Apgars 4/7.;  Surgeon: CLavonia Drafts MD;  [facility];  Service: Obstetrics;  Laterality: N/A;   NO PAST SURGERIES      Family History  Problem Relation Age of Onset   Hypertension Mother    Hypertension Father    Asthma Sister    Hypertension Sister    Diabetes Maternal Grandmother     Social History   Tobacco Use   Smoking status: Former    Packs/day: 0.25    Types: Cigarettes   Smokeless tobacco: Never  Vaping Use   Vaping Use: Never used  Substance Use Topics   Alcohol use: No   Drug use: Not Currently    Types: Marijuana    Allergies: No Known Allergies  No medications prior to admission.   Review of Systems  Constitutional:  Negative for chills, diaphoresis, fatigue and fever.  Eyes:  Negative for visual disturbance.  Respiratory:  Negative for shortness of breath.   Cardiovascular:  Negative for chest pain.  Gastrointestinal:  Positive for nausea and vomiting. Negative for abdominal pain, constipation and diarrhea.  Genitourinary:  Negative for dysuria, flank pain, frequency, pelvic pain, urgency, vaginal bleeding and vaginal discharge.  Neurological:  Negative  for dizziness, weakness, light-headedness and headaches.   Physical Exam   Blood pressure 132/82, pulse 77, temperature 98.1 F (36.7 C), temperature source Oral, resp. rate 17, height 5' 5.5" (1.664 m), weight 97.2 kg, last menstrual period 06/08/2021, SpO2 100 %.  Patient Vitals for the past 24 hrs:  BP Temp Temp src Pulse Resp SpO2 Height Weight  07/31/21 1321 132/82 -- -- 77 17 -- -- --  07/31/21 1212 138/78 -- -- 82 -- -- -- --  07/31/21 1140 121/77 98.1 F (36.7 C) Oral 74 17 100 % 5' 5.5" (1.664 m) 97.2 kg   Physical Exam Vitals and nursing note reviewed.  Constitutional:      General: She is not in acute distress.    Appearance: Normal appearance. She is not ill-appearing, toxic-appearing or diaphoretic.  HENT:      Head: Normocephalic and atraumatic.  Pulmonary:     Effort: Pulmonary effort is normal.  Neurological:     Mental Status: She is alert and oriented to person, place, and time.  Psychiatric:        Mood and Affect: Mood normal.        Behavior: Behavior normal.        Thought Content: Thought content normal.        Judgment: Judgment normal.   Results for orders placed or performed during the hospital encounter of 07/31/21 (from the past 24 hour(s))  Urinalysis, Routine w reflex microscopic Urine, Clean Catch     Status: None   Collection Time: 07/31/21 11:58 AM  Result Value Ref Range   Color, Urine YELLOW YELLOW   APPearance CLEAR CLEAR   Specific Gravity, Urine 1.020 1.005 - 1.030   pH 6.5 5.0 - 8.0   Glucose, UA NEGATIVE NEGATIVE mg/dL   Hgb urine dipstick NEGATIVE NEGATIVE   Bilirubin Urine NEGATIVE NEGATIVE   Ketones, ur NEGATIVE NEGATIVE mg/dL   Protein, ur NEGATIVE NEGATIVE mg/dL   Nitrite NEGATIVE NEGATIVE   Leukocytes,Ua NEGATIVE NEGATIVE   No results found.  MAU Course  Procedures  MDM -pt with normal pregnancy nausea and vomiting [place] to eat and drink at home, not actively vomiting in MAU -UA: WNL -pt OK to be discharged home with no ketones in urine and normal vital signs -RX Diclegis -pt discharged to home in stable condition  Orders Placed This Encounter  Procedures   Urinalysis, Routine w reflex microscopic Urine, Clean Catch    Standing Status:   Standing    Number of Occurrences:   1   Discharge patient    Order Specific Question:   Discharge disposition    Answer:   01-Home or Self Care [1]    Order Specific Question:   Discharge patient date    Answer:   07/31/2021   Meds ordered this encounter  Medications   Doxylamine-Pyridoxine (DICLEGIS) 10-10 MG TBEC    Sig: Take 2 tablets by mouth at bedtime. Start by taking two tablets at bedtime on day 1 and 2; if symptoms persist, take 1 tablet in morning and 2 tablets at bedtime on day 3; if symptoms  persist, may increase to 1 tablet in morning, 1 tablet mid-afternoon, and 2 tablets at bedtime on day 4 (maximum: doxylamine 40 mg/pyridoxine 40 mg (4 tablets) per day).    Dispense:  120 tablet    Refill:  1    Order Specific Question:   Supervising Provider    Answer:   Radene Gunning [id number]   Assessment and Plan  1. Nausea and vomiting in pregnancy   2. [redacted] weeks gestation of pregnancy    Allergies as of 07/31/2021   No Known Allergies      Medication List     TAKE these medications    Doxylamine-Pyridoxine 10-10 MG Tbec Commonly known as: Diclegis Take 2 tablets by mouth at bedtime. Start by taking two tablets at bedtime on day 1 and 2; if symptoms persist, take 1 tablet in morning and 2 tablets at bedtime on day 3; if symptoms persist, may increase to 1 tablet in morning, 1 tablet mid-afternoon, and 2 tablets at bedtime on day 4 (maximum: doxylamine 40 mg/pyridoxine 40 mg (4 tablets) per day).       -return MAU precautions given -pt discharged to home in stable condition  Gerrie Nordmann Kaeleb Emond 07/31/2021, 4:22 PM

## 2021-07-31 NOTE — MAU Note (Signed)
Has been sick, can barely keep anything down. Also wanting an [place], wants to make sure everything is ok with the baby.  Hasn't had one yet.. is a little worried. Has had some cramping off and on. No bleeding.

## 2021-07-31 NOTE — Discharge Instructions (Signed)

## 2021-08-06 ENCOUNTER — Encounter (HOSPITAL_COMMUNITY): Payer: Self-pay

## 2021-08-06 ENCOUNTER — Other Ambulatory Visit: Payer: Self-pay

## 2021-08-06 ENCOUNTER — Ambulatory Visit (HOSPITAL_COMMUNITY)
Admission: EM | Admit: 2021-08-06 | Discharge: 2021-08-06 | Disposition: A | Discharge status: Home / Self Care | Payer: Medicaid Other | Attending: Emergency Medicine | Admitting: Emergency Medicine

## 2021-08-06 DIAGNOSIS — K0889 Other specified disorders of teeth and supporting structures: Secondary | ICD-10-CM | POA: Diagnosis not present

## 2021-08-06 MED ORDER — AMOXICILLIN 500 MG PO CAPS
500.0000 mg | ORAL_CAPSULE | Freq: Three times a day (TID) | ORAL | 0 refills | Status: DC
Start: 2021-08-06 — End: 2021-08-31

## 2021-08-06 MED ORDER — LIDOCAINE VISCOUS HCL 2 % MT SOLN
15.0000 mL | OROMUCOSAL | 0 refills | Status: DC | PRN
Start: 2021-08-06 — End: 2021-08-31

## 2021-08-06 NOTE — ED Triage Notes (Signed)
Pt presents with c/o dental pain. States she thinks she has a dental infection. States she has had it before two times.  Pt states she is pregnant and not aware of what to take for the pain.

## 2021-08-06 NOTE — Discharge Instructions (Addendum)
Tylenol Initial: 325 to 500 mg every 4 hours or 500 to 1,000 mg every 6 hours- do not exceed 400 mg a day   You may use lidocaine solution 15 mL every 4 hours as needed, gargle and spit  Take amoxicillin 3 times a day for the next 7 days to cover for any infection  Please keep upcoming appointment with dentist

## 2021-08-06 NOTE — ED Provider Notes (Signed)
[employer]    CSN: [id number] Arrival date & time: 08/06/21  1637      History   Chief Complaint Chief Complaint  Patient presents with   Dental Pain    HPI Sheryn Leath Ledwith is a 30 y.o. female.   Patient presents with right lower tooth dental pain for 2 days.  Endorses facial swelling and thinks it is infected.  Has had to have dental work on the same tooth 2 times prior.  Denies difficulty swallowing, difficulty breathing, or chills.  Currently [redacted] weeks pregnant.  Has upcoming dental appointment next week.  Has attempted use of Tylenol for pain management, minimal relief.   Past Medical History:  Diagnosis Date   Asthma    exercise induced   Seizures ([place])    febrile as a child    There are no problems to display for this patient.   Past Surgical History:  Procedure Laterality Date   CESAREAN SECTION N/A 04/07/2013   Procedure: Primary cesarean section with delivery of baby boy at 58.  Apgars 4/7.;  Surgeon: Lavonia Drafts, MD;  Location: [facility];  Service: Obstetrics;  Laterality: N/A;   NO PAST SURGERIES      OB History     Gravida  2   Para  1   Term  1   Preterm  0   AB  0   Living  1      SAB  0   IAB  0   Ectopic  0   Multiple  0   Live Births  1            Home Medications    Prior to Admission medications   Medication Sig Start Date End Date Taking? Authorizing Provider  amoxicillin (AMOXIL) 500 MG capsule Take 1 capsule (500 mg total) by mouth 3 (three) times daily. 08/06/21  Yes Lacole Komorowski R, NP  lidocaine (XYLOCAINE) 2 % solution Use as directed 15 mLs in the mouth or throat as needed for mouth pain. 08/06/21  Yes Jarquez Mestre R, NP  Doxylamine-Pyridoxine (DICLEGIS) 10-10 MG TBEC Take 2 tablets by mouth at bedtime. Start by taking two tablets at bedtime on day 1 and 2; if symptoms persist, take 1 tablet in morning and 2 tablets at bedtime on day 3; if symptoms persist, may increase to 1 tablet in morning,  1 tablet mid-afternoon, and 2 tablets at bedtime on day 4 (maximum: doxylamine 40 mg/pyridoxine 40 mg (4 tablets) per day). 07/31/21   Nugent, Gerrie Nordmann, NP    Family History Family History  Problem Relation Age of Onset   Hypertension Mother    Hypertension Father    Asthma Sister    Hypertension Sister    Diabetes Maternal Grandmother     Social History Social History   Tobacco Use   Smoking status: Former    Packs/day: 0.25    Types: Cigarettes   Smokeless tobacco: Never  Vaping Use   Vaping Use: Never used  Substance Use Topics   Alcohol use: No   Drug use: Not Currently    Types: Marijuana     Allergies   Patient has no known allergies.   Review of Systems Review of Systems  Constitutional: Negative.   HENT:  Positive for dental problem. Negative for congestion, drooling, ear discharge, ear pain, facial swelling, hearing loss, mouth sores, nosebleeds, postnasal drip, rhinorrhea, sinus pressure, sinus pain, sneezing, sore throat, tinnitus, trouble swallowing and voice change.   Respiratory:  Negative.    Cardiovascular: Negative.   Skin: Negative.     Physical Exam Triage Vital Signs ED Triage Vitals  Enc Vitals Group     BP 08/06/21 1745 (!) 146/100     Pulse Rate 08/06/21 1744 95     Resp 08/06/21 1744 19     Temp 08/06/21 1744 98.7 F (37.1 C)     Temp Source 08/06/21 1744 Oral     SpO2 08/06/21 1744 100 %     Weight --      Height --      Head Circumference --      Peak Flow --      Pain Score 08/06/21 1743 10     Pain Loc --      Pain Edu? --      Excl. in [place]? --    No data found.  Updated Vital Signs BP (!) 146/100   Pulse 95   Temp 98.7 F (37.1 C) (Oral)   Resp 19   LMP 06/08/2021   SpO2 100%   Visual Acuity Right Eye Distance:   Left Eye Distance:   Bilateral Distance:    Right Eye Near:   Left Eye Near:    Bilateral Near:     Physical Exam Constitutional:      Appearance: Normal appearance. She is normal weight.  HENT:      Head: Normocephalic.     Mouth/Throat:      Comments: Minimal to moderate gingival swelling, hole in first premolar, no abscess, tenderness present Eyes:     Extraocular Movements: Extraocular movements intact.  Neurological:     Mental Status: She is alert.     UC Treatments / Results  Labs (all labs ordered are listed, but only abnormal results are displayed) Labs Reviewed - No data to display  EKG   Radiology No results found.  Procedures Procedures (including critical care time)  Medications Ordered in UC Medications - No data to display  Initial Impression / Assessment and Plan / UC Course  I have reviewed the triage vital signs and the nursing notes.  Pertinent labs & imaging results that were available during my care of the patient were reviewed by me and considered in my medical decision making (see chart for details).  Dental pain  1.  Amoxicillin 500 mg 3 times daily for 7 days to cover any possible infection since patient is currently pregnant 2.  Encouraged continued use of Tylenol for pain management 3.  Lidocaine viscous 2% 15 mils every 4 hours as needed 4.  Encourage patient to keep upcoming appointment with dentist Final Clinical Impressions(s) / UC Diagnoses   Final diagnoses:  Pain, dental     Discharge Instructions      Tylenol Initial: 325 to 500 mg every 4 hours or 500 to 1,000 mg every 6 hours- do not exceed 400 mg a day   You may use lidocaine solution 15 mL every 4 hours as needed, gargle and spit  Take amoxicillin 3 times a day for the next 7 days to cover for any infection  Please keep upcoming appointment with dentist   ED Prescriptions     Medication Sig Dispense Auth. Provider   amoxicillin (AMOXIL) 500 MG capsule Take 1 capsule (500 mg total) by mouth 3 (three) times daily. 21 capsule Candelaria Pies R, NP   lidocaine (XYLOCAINE) 2 % solution Use as directed 15 mLs in the mouth or throat as needed for mouth pain. 100 mL  Hans Eden, NP      PDMP not reviewed this encounter.   Hans Eden, NP 08/06/21 1805

## 2021-08-31 ENCOUNTER — Ambulatory Visit (INDEPENDENT_AMBULATORY_CARE_PROVIDER_SITE_OTHER): Payer: Medicaid Other | Admitting: *Deleted

## 2021-08-31 ENCOUNTER — Other Ambulatory Visit: Payer: Self-pay

## 2021-08-31 VITALS — BP 117/79 | HR 105 | Temp 97.7°F | Ht 65.5 in | Wt 225.2 lb

## 2021-08-31 DIAGNOSIS — Z3480 Encounter for supervision of other normal pregnancy, unspecified trimester: Secondary | ICD-10-CM | POA: Insufficient documentation

## 2021-08-31 DIAGNOSIS — Z3A12 12 weeks gestation of pregnancy: Secondary | ICD-10-CM

## 2021-08-31 MED ORDER — GOJJI WEIGHT SCALE MISC: 1.0000 | Freq: Every day | 0 refills | Status: DC | PRN

## 2021-08-31 MED ORDER — BLOOD PRESSURE MONITOR AUTOMAT DEVI: 1.0000 | Freq: Every day | 0 refills | Status: DC

## 2021-08-31 NOTE — Progress Notes (Signed)
   Location: [facility] Patient: clinic along with her son Provider: clinic  PRENATAL INTAKE SUMMARY  Ms. Inman presents today New OB Nurse Interview.  OB History     Gravida  2   Para  1   Term  1   Preterm  0   AB  0   Living  1      SAB  0   IAB  0   Ectopic  0   Multiple  0   Live Births  1          I have reviewed the patient's medical, obstetrical, social, and family histories, medications, and available lab results.  SUBJECTIVE She has no unusual complaints  OBJECTIVE Initial Nurse interview for history/labs (New OB)  EDD: 03/14/2022 by LMP GA: [redacted]w[redacted]d G2P1001 FHT: 165  GENERAL APPEARANCE: alert, well appearing, in no apparent distress, oriented to person, place and time   ASSESSMENT Normal pregnancy  PLAN Prenatal care:  [facility] Pnl/HIV/Hep C OB Urine Culture GC/CT/PAP at next visit with Gaylan Gerold, CNM 09/11/21 HgbEval/SMA/CF ([place]) Panorama A1C   Follow Up Instructions:   I discussed the assessment and treatment plan with the patient. The patient was provided an opportunity to ask questions and all were answered. The patient agreed with the plan and demonstrated an understanding of the instructions.   The patient was advised to call back or seek an in-person evaluation if the symptoms worsen or if the condition fails to improve as anticipated.  I provided 30 minutes of  face-to-face time during this encounter.  Derl Barrow, RN

## 2021-08-31 NOTE — Patient Instructions (Signed)
Genetic Screening Results Information: You are having genetic testing called [facility] today.  It will take approximately 2 weeks before the results are available.  To get your results, you need Internet access to a web browser to search [place]/MyChart (the direct app on your phone will not give you these results).  Then select Lab Scanned and click on the blue hyper link that says View Image to see your Panorama results.  You can also use the directions on the purple card given to look up your results directly on the [place] website.   Please remember that if you are not [place] to keep your appointment to call the office and reschedule, or cancel. If you no-show or cancel with less than 24 hour notice we will charge you, not your insurance a $50 fee.   AREA PEDIATRIC/FAMILY PRACTICE PHYSICIANS  [facility] [address] [place], [place] [address] Phone - [phone]   Fax - [place] [address] [place], [place]  [address] Phone - [phone]   Fax - [phone]  [place]. [address] [place] [address] [place], [place]  [address] Phone - [phone]   Fax - [phone]  [facility] [address] [place], [place]  [address] Phone - [phone]   Fax - [phone]  [place] [address] [place], [place]  [address] Phone - [phone]   Fax - [place] [address] [place], [place]  [address] Phone - [phone]   Fax - [place] [facility] [place] [address] [place], [place]  [address] Phone - [phone]   Fax - [phone]  [place] [address] [place], [place], [place]  [address] Phone - [phone]   Fax - [place] [address] [place], [place]  [address] Phone - [phone]   Fax -  [phone] [facility] [place]. [address] [place], [place]  [address] Phone - [phone]   Fax - [phone]  [place] [address] [place], [place]  [address] Phone - [phone]   Fax - [phone]  [facility] AT [place], [place], [place]  [address] Phone - [phone]   Fax - [place] [address] [place], [place]  [address] Phone - [phone]   Fax - [phone]  [facility] [address] [place], [place]  [address] Phone - [place], [place]  [address] Phone - [phone]   Fax - [place] [address], [place], [place]  [address] Phone - [phone]   Fax - [phone]  [place] [address] [place], [place]  [address] Phone - [phone]   Fax - [place]. [place], [place]  [address] Phone - [phone]   Fax - [place], [place], [place]  [address] Phone - [phone]   Fax - [place] [address] [place], [place]  [address] Phone - [phone]   Fax - [phone]  [place] [address], [place],   [address] Phone - [phone]   Fax - [phone]  [facility]  [place] [address] [place], [place]  [address] Phone - [phone]   Fax - [phone]  [place] [address] [place], [place]  [address] Phone - [phone]   Fax - [phone] [place] [address] [place], [place]  [address] Phone - [phone]   Fax - [place] [address] [place], [place]  [address] Phone - [phone]   Fax - [place] [address], [place], [place]  [address] Phone - [phone]   Fax - [phone]

## 2021-09-01 LAB — OBSTETRIC PANEL, INCLUDING HIV
Antibody Screen: NEGATIVE
Basophils Absolute: 0 10*3/uL (ref 0.0–0.2)
Basos: 0 %
EOS (ABSOLUTE): 0.3 10*3/uL (ref 0.0–0.4)
Eos: 3 %
HIV Screen 4th Generation wRfx: NONREACTIVE
Hematocrit: 37.8 % (ref 34.0–46.6)
Hemoglobin: 12 g/dL (ref 11.1–15.9)
Hepatitis B Surface Ag: NEGATIVE
Immature Grans (Abs): 0.1 10*3/uL (ref 0.0–0.1)
Immature Granulocytes: 1 %
Lymphocytes Absolute: 2.6 10*3/uL (ref 0.7–3.1)
Lymphs: 24 %
MCH: 27.7 pg (ref 26.6–33.0)
MCHC: 31.7 g/dL (ref 31.5–35.7)
MCV: 87 fL (ref 79–97)
Monocytes Absolute: 0.7 10*3/uL (ref 0.1–0.9)
Monocytes: 6 %
Neutrophils Absolute: 7 10*3/uL (ref 1.4–7.0)
Neutrophils: 66 %
Platelets: 384 10*3/uL (ref 150–450)
RBC: 4.33 x10E6/uL (ref 3.77–5.28)
RDW: 11.9 % (ref 11.7–15.4)
RPR Ser Ql: NONREACTIVE
Rh Factor: POSITIVE
Rubella Antibodies, IGG: 3.57 index (ref >0.99)
WBC: 10.6 10*3/uL (ref 3.4–10.8)

## 2021-09-01 LAB — HEMOGLOBIN A1C
Est. average glucose Bld gHb Est-mCnc: 94 mg/dL
Hgb A1c MFr Bld: 4.9 % (ref 4.8–5.6)

## 2021-09-01 LAB — HEPATITIS C ANTIBODY: Hep C Virus Ab: <0.1 s/co ratio (ref 0.0–0.9)

## 2021-09-03 LAB — CULTURE, OB URINE

## 2021-09-03 LAB — URINE CULTURE, OB REFLEX

## 2021-09-11 ENCOUNTER — Encounter: Payer: Medicaid Other | Admitting: Certified Nurse Midwife

## 2021-09-15 DIAGNOSIS — D563 Thalassemia minor: Secondary | ICD-10-CM | POA: Insufficient documentation

## 2021-09-16 ENCOUNTER — Other Ambulatory Visit (HOSPITAL_COMMUNITY)
Admission: RE | Admit: 2021-09-16 | Discharge: 2021-09-16 | Disposition: A | Discharge status: Home / Self Care | Payer: Medicaid Other | Source: Ambulatory Visit | Attending: Certified Nurse Midwife | Admitting: Certified Nurse Midwife

## 2021-09-16 ENCOUNTER — Ambulatory Visit (INDEPENDENT_AMBULATORY_CARE_PROVIDER_SITE_OTHER): Payer: Medicaid Other | Admitting: Certified Nurse Midwife

## 2021-09-16 ENCOUNTER — Other Ambulatory Visit: Payer: Self-pay

## 2021-09-16 VITALS — BP 126/81 | HR 116 | Temp 97.5°F | Wt 225.8 lb

## 2021-09-16 DIAGNOSIS — B3731 Acute candidiasis of vulva and vagina: Secondary | ICD-10-CM

## 2021-09-16 DIAGNOSIS — Z3A14 14 weeks gestation of pregnancy: Secondary | ICD-10-CM

## 2021-09-16 DIAGNOSIS — O0932 Supervision of pregnancy with insufficient antenatal care, second trimester: Secondary | ICD-10-CM

## 2021-09-16 DIAGNOSIS — Z3480 Encounter for supervision of other normal pregnancy, unspecified trimester: Secondary | ICD-10-CM

## 2021-09-17 LAB — CERVICOVAGINAL ANCILLARY ONLY
Bacterial Vaginitis (gardnerella): NEGATIVE
Candida Glabrata: POSITIVE — AB
Candida Vaginitis: NEGATIVE
Chlamydia: NEGATIVE
Comment: NEGATIVE
Comment: NEGATIVE
Comment: NEGATIVE
Comment: NEGATIVE
Comment: NEGATIVE
Comment: NORMAL
Neisseria Gonorrhea: NEGATIVE
Trichomonas: NEGATIVE

## 2021-09-18 MED ORDER — MICONAZOLE NITRATE 2 % VA CREA: 1.0000 | TOPICAL_CREAM | Freq: Every day | VAGINAL | 2 refills | Status: DC

## 2021-09-18 NOTE — Progress Notes (Signed)
History:   Vung Kush Basic is a 30 y.o. G2P1001 at [redacted]w[redacted]d by LMP being seen today for her first obstetrical visit.  Her obstetrical history is significant for  Cesarean delivery . Patient does intend to breast feed. Pregnancy history fully reviewed.  Patient reports  feeling overall well, nausea has gotten better just occasionally fatigued .   HISTORY: OB History  Gravida Para Term Preterm AB Living  2 1 1  0 0 1  SAB IAB Ectopic Multiple Live Births  0 0 0 0 1    # Outcome Date GA Lbr Len/2nd Weight Sex Delivery Anes PTL Lv  2 Current           1 Term 04/07/13 [redacted]w[redacted]d  6 lb 4.4 oz (2.845 kg) M CS-LTranv Other, EPI  LIV     Birth Comments: none     Name: Zee,BOY Maidie     Apgar1: 4  Apgar5: 7    Needs pap, requests it at next visit since her son is present for this visit.  Past Medical History:  Diagnosis Date   Asthma    exercise induced   Seizures ([place])    febrile as a child   Past Surgical History:  Procedure Laterality Date   CESAREAN SECTION N/A 04/07/2013   Procedure: Primary cesarean section with delivery of baby boy at 14.  Apgars 4/7.;  Surgeon: Lavonia Drafts, MD;  Location: [facility];  Service: Obstetrics;  Laterality: N/A;   NO PAST SURGERIES     Family History  Problem Relation Age of Onset   Hypertension Mother    Hypertension Father    Asthma Sister    Hypertension Sister    Diabetes Maternal Grandmother    Social History   Tobacco Use   Smoking status: Former    Packs/day: 0.25    Types: Cigarettes   Smokeless tobacco: Never  Vaping Use   Vaping Use: Never used  Substance Use Topics   Alcohol use: No   Drug use: Not Currently    Types: Marijuana   No Known Allergies Current Outpatient Medications on File Prior to Visit  Medication Sig Dispense Refill   Blood Pressure Monitoring (BLOOD PRESSURE MONITOR AUTOMAT) DEVI 1 Device by Does not apply route daily. Automatic blood pressure cuff regular size. To monitor blood pressure regularly  at home. ICD-10 code: O92.90 (Patient not taking: Reported on 09/16/2021) 1 each 0   Misc. Devices (GOJJI WEIGHT SCALE) MISC 1 Device by Does not apply route daily as needed. To weight self daily as needed at home. ICD-10 code: O83.90 (Patient not taking: Reported on 09/16/2021) 1 each 0   No current facility-administered medications on file prior to visit.   Review of Systems Pertinent items noted in HPI and remainder of comprehensive ROS otherwise negative. Physical Exam:   Vitals:   09/16/21 1559 09/16/21 1610  BP: 131/78 126/81  Pulse: (!) 105 (!) 116  Temp: (!) 97.5 F (36.4 C)   Weight: 225 lb 12.8 oz (102.4 kg)    Fetal Heart Rate (bpm): 161  Constitutional: Well-developed, well-nourished pregnant female in no acute distress.  HEENT: PERRLA Skin: normal color and turgor, no rash Cardiovascular: normal rate & rhythm Respiratory: normal effort GI: Abd soft, non-tender, gravid appropriate for gestational age MS: Extremities nontender, no edema, normal ROM Neurologic: Alert and oriented x 4.  GU: no CVA tenderness Pelvic: exam deferred  Assessment & Plan:  1. Supervision of other normal pregnancy, antepartum - Doing well, beginning to feel flutters  of fetal movement - Cervicovaginal ancillary only( [place])  2. [redacted] weeks gestation of pregnancy - Routine OB care  3. Initial obstetric visit in second trimester - Initial labs reviewed - Continue prenatal vitamins. - Problem list reviewed and updated. - Genetic Screening discussed, First trimester screen, Quad screen, and NIPS: results reviewed. - Ultrasound discussed; fetal anatomic survey: ordered. - Anticipatory guidance about prenatal visits given including labs, ultrasounds, and testing. - Discussed usage of Babyscripts and virtual visits as additional source of managing and completing prenatal visits in midst of coronavirus and pandemic.   - Encouraged to complete MyChart Registration for her ability to review  results, send requests, and have questions addressed.  - The nature of [place] for [facility] Healthcare/Faculty Practice with multiple MDs and Advanced Practice Providers was explained to patient; also emphasized that residents, students are part of our team. - Routine obstetric precautions reviewed. Encouraged to seek out care at office or emergency room [facility] MA[facility] preferred) for urgent and/or emergent concerns.  Return in about 4 weeks (around 10/14/2021) for IN-PERSON, LOB.    Gaylan Gerold, MSN, CNM, [place] Certified Nurse Midwife, [facility]

## 2021-10-19 ENCOUNTER — Other Ambulatory Visit: Payer: Self-pay | Admitting: *Deleted

## 2021-10-19 ENCOUNTER — Other Ambulatory Visit: Payer: Self-pay | Admitting: Certified Nurse Midwife

## 2021-10-19 ENCOUNTER — Other Ambulatory Visit: Payer: Self-pay

## 2021-10-19 ENCOUNTER — Ambulatory Visit: Payer: Medicaid Other | Attending: Certified Nurse Midwife

## 2021-10-19 DIAGNOSIS — O99212 Obesity complicating pregnancy, second trimester: Secondary | ICD-10-CM | POA: Insufficient documentation

## 2021-10-19 DIAGNOSIS — Z363 Encounter for antenatal screening for malformations: Secondary | ICD-10-CM | POA: Diagnosis not present

## 2021-10-19 DIAGNOSIS — O34219 Maternal care for unspecified type scar from previous cesarean delivery: Secondary | ICD-10-CM | POA: Insufficient documentation

## 2021-10-19 DIAGNOSIS — Z3A19 19 weeks gestation of pregnancy: Secondary | ICD-10-CM | POA: Insufficient documentation

## 2021-10-19 DIAGNOSIS — Z6834 Body mass index (BMI) 34.0-34.9, adult: Secondary | ICD-10-CM

## 2021-10-19 DIAGNOSIS — Z3480 Encounter for supervision of other normal pregnancy, unspecified trimester: Secondary | ICD-10-CM

## 2021-10-19 DIAGNOSIS — Z3A12 12 weeks gestation of pregnancy: Secondary | ICD-10-CM

## 2021-10-19 DIAGNOSIS — Z148 Genetic carrier of other disease: Secondary | ICD-10-CM | POA: Diagnosis not present

## 2021-10-22 ENCOUNTER — Other Ambulatory Visit: Payer: Self-pay

## 2021-10-22 ENCOUNTER — Encounter: Payer: Self-pay | Admitting: Obstetrics and Gynecology

## 2021-10-22 ENCOUNTER — Telehealth (INDEPENDENT_AMBULATORY_CARE_PROVIDER_SITE_OTHER): Payer: Medicaid Other | Admitting: Obstetrics and Gynecology

## 2021-10-22 VITALS — Wt 225.0 lb

## 2021-10-22 DIAGNOSIS — D563 Thalassemia minor: Secondary | ICD-10-CM

## 2021-10-22 DIAGNOSIS — O352XX0 Maternal care for (suspected) hereditary disease in fetus, not applicable or unspecified: Secondary | ICD-10-CM

## 2021-10-22 DIAGNOSIS — Z3A19 19 weeks gestation of pregnancy: Secondary | ICD-10-CM

## 2021-10-22 DIAGNOSIS — Z3480 Encounter for supervision of other normal pregnancy, unspecified trimester: Secondary | ICD-10-CM

## 2021-10-22 NOTE — Progress Notes (Signed)
   MY CHART VIDEO VIRTUAL OBSTETRICS VISIT ENCOUNTER NOTE  I connected with [name] on 10/22/21 at  4:15 PM EST by My Chart video at home and verified that I am speaking with the correct person using two identifiers. Provider located at [employer] for [employer] at [place].   I discussed the limitations, risks, security and privacy concerns of performing an evaluation and management service by My Chart video and the availability of in person appointments. I also discussed with the patient that there may be a patient responsible charge related to this service. The patient expressed understanding and agreed to proceed.  I discussed the limitations of telemedicine and the availability of in person appointments.  Discussed there is a possibility of technology failure and discussed alternative modes of communication if that failure occurs.  Subjective:  Alison Archer is a 30 y.o. G2P1001 at [redacted]w[redacted]d being followed for ongoing prenatal care.  She is currently monitored for the following issues for this low-risk pregnancy and has Supervision of other normal pregnancy, antepartum and Alpha thalassemia silent carrier on their problem list.  Patient reports carpal tunnel symptoms. Reports fetal movement. Denies any contractions, bleeding or leaking of fluid.   The following portions of the patient's history were reviewed and updated as appropriate: allergies, current medications, past family history, past medical history, past social history, past surgical history and problem list.   Objective:   General:  Alert, oriented and cooperative.   Mental Status: Normal mood and affect perceived. Normal judgment and thought content.  Rest of physical exam deferred due to type of encounter  **No BP cuff or scale at home** Wt 225 lb (102.1 kg)   LMP 06/08/2021   BMI 36.87 kg/m    Assessment and Plan:  Pregnancy: G2P1001 at [redacted]w[redacted]d  1. Supervision of other normal pregnancy, antepartum - Can get  her sized for bilateral wrists splints  2. Alpha thalassemia silent carrier - Scheduled to see genetic counselor on 11/18/21  Preterm labor symptoms and general obstetric precautions including but not limited to vaginal bleeding, contractions, leaking of fluid and fetal movement were reviewed in detail with the patient.  I discussed the assessment and treatment plan with the patient. The patient was provided an opportunity to ask questions and all were answered. The patient agreed with the plan and demonstrated an understanding of the instructions. The patient was advised to call back or seek an in-person office evaluation/go to MAU at [facility] for any urgent or concerning symptoms. Please refer to After Visit Summary for other counseling recommendations.   I provided 5 minutes of non-face-to-face time during this encounter. There was 5 minutes of chart review time spent prior to this encounter. Total time spent = 10 minutes.  Return in about 4 weeks (around 11/19/2021) for Return OB visit and pap.  Future Appointments  Date Time Provider [place]  11/18/2021 12:45 PM [facility] NURSE [facility]  11/18/2021  1:00 PM [facility] US1 [facility]  11/18/2021  2:30 PM [facility] GENETIC COUNSELING RM [facility]  11/19/2021  4:15 PM Laury Deep, CNM [facility] None    Laury Deep, [place] for [employer], [place]

## 2021-10-22 NOTE — Patient Instructions (Signed)

## 2021-10-23 ENCOUNTER — Other Ambulatory Visit: Payer: Self-pay | Admitting: *Deleted

## 2021-10-23 DIAGNOSIS — G5603 Carpal tunnel syndrome, bilateral upper limbs: Secondary | ICD-10-CM

## 2021-10-23 DIAGNOSIS — O26899 Other specified pregnancy related conditions, unspecified trimester: Secondary | ICD-10-CM

## 2021-10-23 DIAGNOSIS — G5600 Carpal tunnel syndrome, unspecified upper limb: Secondary | ICD-10-CM

## 2021-10-26 ENCOUNTER — Other Ambulatory Visit: Payer: Self-pay | Admitting: *Deleted

## 2021-10-26 DIAGNOSIS — Z3A12 12 weeks gestation of pregnancy: Secondary | ICD-10-CM

## 2021-10-26 DIAGNOSIS — Z3480 Encounter for supervision of other normal pregnancy, unspecified trimester: Secondary | ICD-10-CM

## 2021-10-26 MED ORDER — WRIST BRACE MISC: 2.0000 | Freq: Every day | 0 refills | Status: DC | PRN

## 2021-10-26 MED ORDER — BLOOD PRESSURE MONITOR AUTOMAT DEVI: 1.0000 | Freq: Every day | 0 refills | Status: DC

## 2021-11-18 ENCOUNTER — Ambulatory Visit: Payer: Medicaid Other | Admitting: *Deleted

## 2021-11-18 ENCOUNTER — Ambulatory Visit: Payer: Medicaid Other

## 2021-11-18 ENCOUNTER — Ambulatory Visit: Payer: Medicaid Other | Attending: Obstetrics and Gynecology

## 2021-11-18 ENCOUNTER — Telehealth: Payer: Self-pay | Admitting: Genetics

## 2021-11-18 ENCOUNTER — Other Ambulatory Visit: Payer: Self-pay | Admitting: *Deleted

## 2021-11-18 ENCOUNTER — Other Ambulatory Visit: Payer: Self-pay

## 2021-11-18 VITALS — BP 125/66 | HR 111

## 2021-11-18 DIAGNOSIS — Z148 Genetic carrier of other disease: Secondary | ICD-10-CM | POA: Diagnosis not present

## 2021-11-18 DIAGNOSIS — Z6834 Body mass index (BMI) 34.0-34.9, adult: Secondary | ICD-10-CM | POA: Diagnosis present

## 2021-11-18 DIAGNOSIS — D563 Thalassemia minor: Secondary | ICD-10-CM | POA: Insufficient documentation

## 2021-11-18 DIAGNOSIS — Z362 Encounter for other antenatal screening follow-up: Secondary | ICD-10-CM

## 2021-11-18 DIAGNOSIS — O99212 Obesity complicating pregnancy, second trimester: Secondary | ICD-10-CM | POA: Insufficient documentation

## 2021-11-18 DIAGNOSIS — Z3480 Encounter for supervision of other normal pregnancy, unspecified trimester: Secondary | ICD-10-CM | POA: Insufficient documentation

## 2021-11-18 DIAGNOSIS — E669 Obesity, unspecified: Secondary | ICD-10-CM | POA: Diagnosis not present

## 2021-11-18 DIAGNOSIS — O34219 Maternal care for unspecified type scar from previous cesarean delivery: Secondary | ICD-10-CM | POA: Diagnosis not present

## 2021-11-18 DIAGNOSIS — Z3A23 23 weeks gestation of pregnancy: Secondary | ICD-10-CM | POA: Insufficient documentation

## 2021-11-18 NOTE — Telephone Encounter (Signed)
Called [facility] to reschedule genetic counseling to discuss alpha thalassemia carrier screening result. Left voicemail with [facility] call back number.

## 2021-11-19 ENCOUNTER — Telehealth (INDEPENDENT_AMBULATORY_CARE_PROVIDER_SITE_OTHER): Payer: Medicaid Other | Admitting: Obstetrics and Gynecology

## 2021-11-19 ENCOUNTER — Encounter: Payer: Self-pay | Admitting: Obstetrics and Gynecology

## 2021-11-19 VITALS — BP 125/66 | HR 111

## 2021-11-19 DIAGNOSIS — Z3A23 23 weeks gestation of pregnancy: Secondary | ICD-10-CM

## 2021-11-19 DIAGNOSIS — Z3480 Encounter for supervision of other normal pregnancy, unspecified trimester: Secondary | ICD-10-CM

## 2021-11-19 MED ORDER — GOJJI WEIGHT SCALE MISC: 1.0000 | Freq: Every day | 0 refills | Status: DC | PRN

## 2021-11-19 NOTE — Progress Notes (Signed)
° °  MY CHART VIDEO VIRTUAL OBSTETRICS VISIT ENCOUNTER NOTE  I connected with Alison Archer on 11/19/21 at  1:30 PM EST by My Chart video at home and verified that I am speaking with the correct person using two identifiers. Provider located at [employer] for [employer] at [place].   I discussed the limitations, risks, security and privacy concerns of performing an evaluation and management service by My Chart video and the availability of in person appointments. I also discussed with the patient that there may be a patient responsible charge related to this service. The patient expressed understanding and agreed to proceed.  I discussed the limitations of telemedicine and the availability of in person appointments.  Discussed there is a possibility of technology failure and discussed alternative modes of communication if that failure occurs.  Subjective:  Alison Archer is a 30 y.o. G2P1001 at [redacted]w[redacted]d being followed for ongoing prenatal care.  She is currently monitored for the following issues for this low-risk pregnancy and has Supervision of other normal pregnancy, antepartum and Alpha thalassemia silent carrier on their problem list.  Patient reports  stuffy nose and dry throat . She has been taking OTC from the safe med list. Reports fetal movement. Denies any contractions, bleeding or leaking of fluid.   The following portions of the patient's history were reviewed and updated as appropriate: allergies, current medications, past family history, past medical history, past social history, past surgical history and problem list.   Objective:   General:  Alert, oriented and cooperative.   Mental Status: Normal mood and affect perceived. Normal judgment and thought content.  Rest of physical exam deferred due to type of encounter  BP 125/66    Pulse (!) 111    LMP 06/08/2021  **Done by patient's own at home BP cuff  Assessment and Plan:  Pregnancy: G2P1001 at [redacted]w[redacted]d  1. Supervision  of other normal pregnancy, antepartum - Rx for Misc. Devices (GOJJI WEIGHT SCALE) MISC; 1 Device by Does not apply route daily as needed. To weight self daily as needed at home. ICD-10 code: Z34.90  Dispense: 1 each; Refill: 0  2. [redacted] weeks gestation of pregnancy    Preterm labor symptoms and general obstetric precautions including but not limited to vaginal bleeding, contractions, leaking of fluid and fetal movement were reviewed in detail with the patient.  I discussed the assessment and treatment plan with the patient. The patient was provided an opportunity to ask questions and all were answered. The patient agreed with the plan and demonstrated an understanding of the instructions. The patient was advised to call back or seek an in-person office evaluation/go to MAU at [facility] for any urgent or concerning symptoms. Please refer to After Visit Summary for other counseling recommendations.   I provided 5 minutes of non-face-to-face time during this encounter. There was 5 minutes of chart review time spent prior to this encounter. Total time spent = 10 minutes.  No follow-ups on file.  Future Appointments  Date Time Provider [place]  12/16/2021  2:30 PM [facility] NURSE [facility]  12/16/2021  2:45 PM [facility] US5 [facility]  12/17/2021  8:35 AM Laury Deep, CNM [facility] None    Laury Deep, [place] for [employer], [place]

## 2021-11-22 NOTE — L&D Delivery Note (Addendum)
OB/GYN Faculty Practice Delivery Note ? ?Alison Archer is a 31 y.o. G2P1001 s/p VD/VBAC at [redacted]w[redacted]d She was admitted for IOL due to pre-eclampsia without severe features.  ? ?ROM: 24h 145mith clear fluid ?GBS Status: negative (but received PCN while unknown)   ?Maximum Maternal Temperature: 101.62F (Triple I, given Amp/Gent)  ? ?Labor Progress: ?Initial SVE: 1/thick/ballotable. She then progressed to complete with assistance of FB, pit, and AROM.   ? ?Delivery Date/Time: 4/22 at 1658  ?Delivery: Called to room and patient was complete and 0 station (caput 1+). Pushes with 3 contractions total. Head delivered direct OA. No nuchal cord present but true knot in the cord seen x1. Shoulder and body delivered in usual fashion. Infant with spontaneous cry, placed on mother's abdomen, dried and stimulated. Cord clamped x 2 after 1-minute delay, and cut by FOB. Cord blood drawn. Placenta delivered spontaneously with gentle cord traction. Fundus firm with massage and Pitocin. Prophylactic TXA given. Labia, perineum, vagina, and cervix inspected with no lacerations.  ?Baby Weight: pending ? ?Placenta: 3 vessel, intact. Sent to pathology.  ?Complications: None ?Lacerations: None ?EBL: 200 mL ?Analgesia: Epidural  ? ?Infant:  ?APGAR (1 MIN): 7   ?APGAR (5 MINS): 8   ? ?SaDarrelyn HillockDO  ?OBK[facility] Fellow, Faculty Practice ?[facility] for [place]/22/2023, 5:18 PM ? ? ? ?  ?

## 2021-12-10 ENCOUNTER — Encounter: Payer: Self-pay | Admitting: Obstetrics and Gynecology

## 2021-12-16 ENCOUNTER — Other Ambulatory Visit: Payer: Self-pay

## 2021-12-16 ENCOUNTER — Encounter: Payer: Self-pay | Admitting: *Deleted

## 2021-12-16 ENCOUNTER — Ambulatory Visit: Payer: Medicaid Other | Attending: Obstetrics

## 2021-12-16 ENCOUNTER — Ambulatory Visit: Payer: Medicaid Other | Admitting: *Deleted

## 2021-12-16 VITALS — BP 114/62 | HR 93

## 2021-12-16 DIAGNOSIS — Z362 Encounter for other antenatal screening follow-up: Secondary | ICD-10-CM

## 2021-12-16 DIAGNOSIS — Z3480 Encounter for supervision of other normal pregnancy, unspecified trimester: Secondary | ICD-10-CM

## 2021-12-16 DIAGNOSIS — Z148 Genetic carrier of other disease: Secondary | ICD-10-CM | POA: Diagnosis not present

## 2021-12-16 DIAGNOSIS — E668 Other obesity: Secondary | ICD-10-CM | POA: Diagnosis not present

## 2021-12-16 DIAGNOSIS — D563 Thalassemia minor: Secondary | ICD-10-CM | POA: Insufficient documentation

## 2021-12-16 DIAGNOSIS — O99212 Obesity complicating pregnancy, second trimester: Secondary | ICD-10-CM | POA: Diagnosis present

## 2021-12-16 DIAGNOSIS — O34219 Maternal care for unspecified type scar from previous cesarean delivery: Secondary | ICD-10-CM | POA: Insufficient documentation

## 2021-12-16 DIAGNOSIS — Z3A27 27 weeks gestation of pregnancy: Secondary | ICD-10-CM | POA: Insufficient documentation

## 2021-12-17 ENCOUNTER — Other Ambulatory Visit (HOSPITAL_COMMUNITY)
Admission: RE | Admit: 2021-12-17 | Discharge: 2021-12-17 | Disposition: A | Discharge status: Home / Self Care | Payer: Medicaid Other | Source: Ambulatory Visit | Attending: Obstetrics and Gynecology | Admitting: Obstetrics and Gynecology

## 2021-12-17 ENCOUNTER — Encounter: Payer: Self-pay | Admitting: Obstetrics and Gynecology

## 2021-12-17 ENCOUNTER — Ambulatory Visit (INDEPENDENT_AMBULATORY_CARE_PROVIDER_SITE_OTHER): Payer: Medicaid Other | Admitting: Obstetrics and Gynecology

## 2021-12-17 ENCOUNTER — Other Ambulatory Visit: Payer: Self-pay | Admitting: *Deleted

## 2021-12-17 VITALS — BP 121/76 | HR 97 | Temp 98.4°F | Wt 256.2 lb

## 2021-12-17 DIAGNOSIS — N898 Other specified noninflammatory disorders of vagina: Secondary | ICD-10-CM | POA: Diagnosis present

## 2021-12-17 DIAGNOSIS — O26899 Other specified pregnancy related conditions, unspecified trimester: Secondary | ICD-10-CM | POA: Diagnosis present

## 2021-12-17 DIAGNOSIS — Z6834 Body mass index (BMI) 34.0-34.9, adult: Secondary | ICD-10-CM

## 2021-12-17 DIAGNOSIS — O34219 Maternal care for unspecified type scar from previous cesarean delivery: Secondary | ICD-10-CM

## 2021-12-17 DIAGNOSIS — D563 Thalassemia minor: Secondary | ICD-10-CM

## 2021-12-17 DIAGNOSIS — Z3480 Encounter for supervision of other normal pregnancy, unspecified trimester: Secondary | ICD-10-CM | POA: Diagnosis present

## 2021-12-17 DIAGNOSIS — Z3A27 27 weeks gestation of pregnancy: Secondary | ICD-10-CM

## 2021-12-17 NOTE — Progress Notes (Addendum)
° °  LOW-RISK PREGNANCY OFFICE VISIT Patient name: Alison Archer MRN [id number]  Date of birth: 1991/06/19 Chief Complaint:   Routine Prenatal Visit  History of Present Illness:   Alison Archer is a 31 y.o. G19P1001 female at 35w3dwith an Estimated Date of Delivery: 03/15/22 being seen today for ongoing management of a low-risk pregnancy.  Today she reports vaginal irritation. Contractions: Not present. Vag. Bleeding: None.  Movement: Present. denies leaking of fluid. Review of Systems:   Pertinent items are noted in HPI Denies abnormal vaginal discharge w/ itching/odor/irritation, headaches, visual changes, shortness of breath, chest pain, abdominal pain, severe nausea/vomiting, or problems with urination or bowel movements unless otherwise stated above. Pertinent History Reviewed:  Reviewed past medical,surgical, social, obstetrical and family history.  Reviewed problem list, medications and allergies. Physical Assessment:   Vitals:   12/17/21 0822  BP: 121/76  Pulse: 97  Temp: 98.4 F (36.9 C)  Weight: 256 lb 3.2 oz (116.2 kg)  Body mass index is 41.99 kg/m.        Physical Examination:   General appearance: Well appearing, and in no distress  Mental status: Alert, oriented to person, place, and time  Skin: Warm & dry  Cardiovascular: Normal heart rate noted  Respiratory: Normal respiratory effort, no distress  Abdomen: Soft, gravid, nontender  Pelvic: Cervical exam deferred        Samples gathered via self-swab  Extremities: Edema: None  Fetal Status: Fetal Heart Rate (bpm): 151 Fundal Height: 35 cm Movement: Present    No results found for this or any previous visit (from the past 24 hour(s)).  Assessment & Plan:  1) Low-risk pregnancy G2P1001 at 21w3dith an Estimated Date of Delivery: 03/15/22   2) Supervision of other normal pregnancy, antepartum  - Glucose Tolerance, 2 Hours w/1 Hour,  - HIV Antibody (routine testing w rflx),  - RPR,  - CBC,  - Cervicovaginal  ancillary only( [place])  3) Vaginal discharge during pregnancy, antepartum  - Cervicovaginal ancillary only( [place] 4) Vaginal odor  - Cervicovaginal ancillary only( [place])  5) Vaginal itching  - Cervicovaginal ancillary only( [place] 6) Previous cesarean delivery affecting pregnancy, antepartum - Planning TOLAC - Met with Dr. DuDamita Dunningst last visit, but did not sign consent - No further questions, understands risk of TOLAC - TOLAC consent signed today  7) [redacted] weeks gestation of pregnancy    Meds: No orders of the defined types were placed in this encounter.  Labs/procedures today: 2 hr GTT, 3rd trimester labs, Wet Prep  Plan:  Continue routine obstetrical care   Reviewed: Preterm labor symptoms and general obstetric precautions including but not limited to vaginal bleeding, contractions, leaking of fluid and fetal movement were reviewed in detail with the patient.  All questions were answered. Has home bp cuff. Check bp weekly, let usKoreanow if >140/90.   Follow-up: Return in about 4 weeks (around 01/14/2022) for Return OB - My Chart video.  Orders Placed This Encounter  Procedures   Glucose Tolerance, 2 Hours w/1 Hour   HIV Antibody (routine testing w rflx)   RPR   CBC   RoLaury DeepSN, CNM 12/17/2021 12:30 PM

## 2021-12-18 ENCOUNTER — Other Ambulatory Visit: Payer: Self-pay | Admitting: *Deleted

## 2021-12-18 ENCOUNTER — Other Ambulatory Visit: Payer: Self-pay | Admitting: Obstetrics and Gynecology

## 2021-12-18 ENCOUNTER — Encounter: Payer: Self-pay | Admitting: Obstetrics and Gynecology

## 2021-12-18 DIAGNOSIS — B9689 Other specified bacterial agents as the cause of diseases classified elsewhere: Secondary | ICD-10-CM

## 2021-12-18 DIAGNOSIS — N760 Acute vaginitis: Secondary | ICD-10-CM

## 2021-12-18 DIAGNOSIS — O34219 Maternal care for unspecified type scar from previous cesarean delivery: Secondary | ICD-10-CM

## 2021-12-18 DIAGNOSIS — Z6834 Body mass index (BMI) 34.0-34.9, adult: Secondary | ICD-10-CM

## 2021-12-18 DIAGNOSIS — D563 Thalassemia minor: Secondary | ICD-10-CM

## 2021-12-18 LAB — CBC
Hematocrit: 34.1 % (ref 34.0–46.6)
Hemoglobin: 11.1 g/dL (ref 11.1–15.9)
MCH: 27.5 pg (ref 26.6–33.0)
MCHC: 32.6 g/dL (ref 31.5–35.7)
MCV: 85 fL (ref 79–97)
Platelets: 347 10*3/uL (ref 150–450)
RBC: 4.03 x10E6/uL (ref 3.77–5.28)
RDW: 12.3 % (ref 11.7–15.4)
WBC: 8.7 10*3/uL (ref 3.4–10.8)

## 2021-12-18 LAB — CERVICOVAGINAL ANCILLARY ONLY
Bacterial Vaginitis (gardnerella): POSITIVE — AB
Candida Glabrata: NEGATIVE
Candida Vaginitis: NEGATIVE
Chlamydia: NEGATIVE
Comment: NEGATIVE
Comment: NEGATIVE
Comment: NEGATIVE
Comment: NEGATIVE
Comment: NEGATIVE
Comment: NORMAL
Neisseria Gonorrhea: NEGATIVE
Trichomonas: NEGATIVE

## 2021-12-18 LAB — GLUCOSE TOLERANCE, 2 HOURS W/ 1HR
Glucose, 1 hour: 160 mg/dL (ref 70–179)
Glucose, 2 hour: 110 mg/dL (ref 70–152)
Glucose, Fasting: 85 mg/dL (ref 70–91)

## 2021-12-18 LAB — HIV ANTIBODY (ROUTINE TESTING W REFLEX): HIV Screen 4th Generation wRfx: NONREACTIVE

## 2021-12-18 LAB — RPR: RPR Ser Ql: NONREACTIVE

## 2021-12-18 MED ORDER — METRONIDAZOLE 500 MG PO TABS: 500.0000 mg | ORAL_TABLET | Freq: Two times a day (BID) | ORAL | 0 refills | Status: DC

## 2021-12-18 NOTE — Progress Notes (Signed)
MyChart message sent to patient.

## 2021-12-30 ENCOUNTER — Other Ambulatory Visit: Payer: Self-pay

## 2021-12-30 ENCOUNTER — Ambulatory Visit (INDEPENDENT_AMBULATORY_CARE_PROVIDER_SITE_OTHER): Payer: Medicaid Other

## 2021-12-30 VITALS — BP 124/70 | HR 113 | Temp 98.2°F | Wt 262.2 lb

## 2021-12-30 DIAGNOSIS — Z3A29 29 weeks gestation of pregnancy: Secondary | ICD-10-CM

## 2021-12-30 DIAGNOSIS — Z3480 Encounter for supervision of other normal pregnancy, unspecified trimester: Secondary | ICD-10-CM

## 2021-12-30 DIAGNOSIS — O99210 Obesity complicating pregnancy, unspecified trimester: Secondary | ICD-10-CM

## 2021-12-30 NOTE — Progress Notes (Signed)
° °  [place] PREGNANCY OFFICE VISIT  Patient name: Alison Archer MRN [id number]  Date of birth: [birth year] Chief Complaint:   Routine Prenatal Visit  Subjective:   Alison Archer is a 31 y.o. G7P1001 female at [redacted]w[redacted]d with an Estimated Date of Delivery: 03/15/22 being seen today for ongoing management of a high-risk pregnancy aeb has Supervision of other normal pregnancy, antepartum and Alpha thalassemia silent carrier on their problem list.  Patient presents today with  [facility] Contractions .  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 Patient endorses fetal movement. Patient denies abdominal cramping or contractions.  Patient denies vaginal concerns including abnormal discharge, leaking of fluid, and bleeding.  Contractions: Irritability. Vag. Bleeding: None.  Movement: Present.  Reviewed past medical,surgical, social, obstetrical and family history as well as problem list, medications and allergies.  Objective   Vitals:   12/30/21 1613  BP: 124/70  Pulse: (!) 113  Temp: 98.2 F (36.8 C)  Weight: 262 lb 3.2 oz (118.9 kg)  Body mass index is 42.97 kg/m.  Total Weight Gain:50 lb 3.2 oz (22.8 kg)         Physical Examination:   General appearance: Well appearing, and in no distress  Mental status: Alert, oriented to person, place, and time  Skin: Warm & dry  Cardiovascular: Normal heart rate noted  Respiratory: Normal respiratory effort, no distress  Abdomen: Soft, gravid, nontender, AGA with Fundal Height: 30 cm  Pelvic: Cervical exam deferred           Extremities: Edema: Mild pitting, slight indentation  Fetal Status: Fetal Heart Rate (bpm): 155  Movement: Present   No results found for this or any previous visit (from the past 24 hour(s)).  Assessment & Plan:  High-risk pregnancy of a 31 y.o., G2P1001 at [redacted]w[redacted]d with an Estimated Date of Delivery: 03/15/22   1. Supervision of other normal pregnancy, antepartum -Anticipatory guidance  for upcoming appts. -Patient to schedule next appt in 2-3 weeks for an in-person visit. -Patient aware of office closing and will be transferred to [facility]  2. [redacted] weeks gestation of pregnancy -Doing well. -Informed that being out of work is an option, but would cut through her FMLA time after delivery. -Patient verbalizes understanding and opts to continue to work.  -Encouraged frequent rests and hydration.   3. Obesity in pregnancy -TWG 50 lbs! -Discussed healthy eating habits including high protein diet.      Meds: No orders of the defined types were placed in this encounter.  Labs/procedures today:  Lab Orders  No laboratory test(s) ordered today     Reviewed: Preterm labor symptoms and general obstetric precautions including but not limited to vaginal bleeding, contractions, leaking of fluid and fetal movement were reviewed in detail with the patient.  All questions were answered.  Follow-up: Return in about 2 weeks (around 01/13/2022).  No orders of the defined types were placed in this encounter.  Maryann Conners MSN, CNM 12/30/2021

## 2021-12-30 NOTE — Progress Notes (Signed)
Pt presents for ROB c/o increased bilateral ankle edema.

## 2022-01-14 ENCOUNTER — Telehealth (INDEPENDENT_AMBULATORY_CARE_PROVIDER_SITE_OTHER): Payer: Medicaid Other | Admitting: Obstetrics and Gynecology

## 2022-01-14 ENCOUNTER — Other Ambulatory Visit: Payer: Self-pay

## 2022-01-14 VITALS — BP 137/81 | HR 108

## 2022-01-14 DIAGNOSIS — Z3A31 31 weeks gestation of pregnancy: Secondary | ICD-10-CM

## 2022-01-14 DIAGNOSIS — Z3480 Encounter for supervision of other normal pregnancy, unspecified trimester: Secondary | ICD-10-CM

## 2022-01-14 DIAGNOSIS — O99210 Obesity complicating pregnancy, unspecified trimester: Secondary | ICD-10-CM

## 2022-01-14 DIAGNOSIS — O99213 Obesity complicating pregnancy, third trimester: Secondary | ICD-10-CM

## 2022-01-14 DIAGNOSIS — O34219 Maternal care for unspecified type scar from previous cesarean delivery: Secondary | ICD-10-CM

## 2022-01-14 NOTE — Progress Notes (Signed)
MY CHART VIDEO VIRTUAL OBSTETRICS VISIT ENCOUNTER NOTE  I connected with [place] on 01/14/22 at  2:15 PM EST by My Chart video at home and verified that I am speaking with the correct person using two identifiers. Provider located at [employer] for [employer] at [place].   I discussed the limitations, risks, security and privacy concerns of performing an evaluation and management service by My Chart video and the availability of in person appointments. I also discussed with the patient that there may be a patient responsible charge related to this service. The patient expressed understanding and agreed to proceed.  I discussed the limitations of telemedicine and the availability of in person appointments.  Discussed there is a possibility of technology failure and discussed alternative modes of communication if that failure occurs.  Subjective:  Alison Archer is a 31 y.o. G2P1001 at [redacted]w[redacted]d being followed for ongoing prenatal care.  She is currently monitored for the following issues for this low-risk pregnancy and has Supervision of other normal pregnancy, antepartum; Alpha thalassemia silent carrier; and Obesity in pregnancy on their problem list.  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 No complaints of contractions once off of work. Reports fetal movement. Denies any contractions, bleeding or leaking of fluid.   The following portions of the patient's history were reviewed and updated as appropriate: allergies, current medications, past family history, past medical history, past social history, past surgical history and problem list.   Objective:   General:  Alert, oriented and cooperative.   Mental Status: Normal mood and affect perceived. Normal judgment and thought content.  Rest of physical exam deferred due to type of encounter  BP 137/81    Pulse (!) 108    LMP 06/08/2021  **Done by patient's own at  home BP cuff   Assessment and Plan:  Pregnancy: G2P1001 at [redacted]w[redacted]d  1. Supervision of other normal pregnancy, antepartum - Reviewed that being up on her feet for long periods of time can cause BH ctxs - Advised to drink plenty of H2O during the day  2. Previous cesarean delivery affecting pregnancy, antepartum - Planning TOLAC -- need sot sign consent at nv  3. Obesity in pregnancy - Taking daily bASA  4. [redacted] weeks gestation of pregnancy   Preterm labor symptoms and general obstetric precautions including but not limited to vaginal bleeding, contractions, leaking of fluid and fetal movement were reviewed in detail with the patient.  I discussed the assessment and treatment plan with the patient. The patient was provided an opportunity to ask questions and all were answered. The patient agreed with the plan and demonstrated an understanding of the instructions. The patient was advised to call back or seek an in-person office evaluation/go to MAU at [facility] for any urgent or concerning symptoms. Please refer to After Visit Summary for other counseling recommendations.   I provided 5 minutes of non-face-to-face time during this encounter. There was 5 minutes of chart review time spent prior to this encounter. Total time spent = 10 minutes.  Return in about 4 weeks (around 02/11/2022) for Return OB visit.  Future Appointments  Date Time Provider [place]  01/20/2022  2:15 PM [facility] NURSE [facility]  01/20/2022  2:30 PM [facility] US3 [facility]  01/20/2022  3:55 PM [place], Mervyn Skeeters, CNM [facility] [place], [place] for [facility]

## 2022-01-15 ENCOUNTER — Encounter: Payer: Self-pay | Admitting: Obstetrics and Gynecology

## 2022-01-20 ENCOUNTER — Encounter: Payer: Self-pay | Admitting: Student

## 2022-01-20 ENCOUNTER — Other Ambulatory Visit: Payer: Self-pay

## 2022-01-20 ENCOUNTER — Ambulatory Visit (INDEPENDENT_AMBULATORY_CARE_PROVIDER_SITE_OTHER): Payer: Medicaid Other | Admitting: Student

## 2022-01-20 ENCOUNTER — Ambulatory Visit: Payer: Medicaid Other | Attending: Maternal & Fetal Medicine

## 2022-01-20 ENCOUNTER — Ambulatory Visit: Payer: Medicaid Other | Admitting: *Deleted

## 2022-01-20 VITALS — BP 130/64 | HR 101

## 2022-01-20 VITALS — Wt 263.0 lb

## 2022-01-20 DIAGNOSIS — E669 Obesity, unspecified: Secondary | ICD-10-CM | POA: Diagnosis not present

## 2022-01-20 DIAGNOSIS — Z148 Genetic carrier of other disease: Secondary | ICD-10-CM | POA: Insufficient documentation

## 2022-01-20 DIAGNOSIS — D563 Thalassemia minor: Secondary | ICD-10-CM

## 2022-01-20 DIAGNOSIS — Z6834 Body mass index (BMI) 34.0-34.9, adult: Secondary | ICD-10-CM

## 2022-01-20 DIAGNOSIS — Z362 Encounter for other antenatal screening follow-up: Secondary | ICD-10-CM | POA: Insufficient documentation

## 2022-01-20 DIAGNOSIS — Z3A32 32 weeks gestation of pregnancy: Secondary | ICD-10-CM

## 2022-01-20 DIAGNOSIS — O99013 Anemia complicating pregnancy, third trimester: Secondary | ICD-10-CM | POA: Diagnosis not present

## 2022-01-20 DIAGNOSIS — Z3480 Encounter for supervision of other normal pregnancy, unspecified trimester: Secondary | ICD-10-CM

## 2022-01-20 DIAGNOSIS — O99213 Obesity complicating pregnancy, third trimester: Secondary | ICD-10-CM

## 2022-01-20 DIAGNOSIS — O34219 Maternal care for unspecified type scar from previous cesarean delivery: Secondary | ICD-10-CM

## 2022-01-20 NOTE — Progress Notes (Signed)
? ?  PRENATAL VISIT NOTE ? ?Subjective:  ?Alison Archer is a 31 y.o. G2P1001 at [redacted]w[redacted]d being seen today for ongoing prenatal care.  She is currently monitored for the following issues for this low-risk pregnancy and has Supervision of other normal pregnancy, antepartum; Alpha thalassemia silent carrier; and Obesity in pregnancy on their problem list. ? ?Patient reports no complaints.  Contractions: Not present. Vag. Bleeding: None.  Movement: Present. Denies leaking of fluid.  ? ?The following portions of the patient's history were reviewed and updated as appropriate: allergies, current medications, past family history, past medical history, past social history, past surgical history and problem list.  ? ?Objective:  ? ?Vitals:  ? 01/20/22 1516  ?Weight: 263 lb (119.3 kg)  ? ? ?Fetal Status:   Fundal Height: 34 cm Movement: Present   Normal growth at MFM with cardiac activity ? ?General:  Alert, oriented and cooperative. Patient is in no acute distress.  ?Skin: Skin is warm and dry. No rash noted.   ?Cardiovascular: Normal heart rate noted  ?Respiratory: Normal respiratory effort, no problems with respiration noted  ?Abdomen: Soft, gravid, appropriate for gestational age.  Pain/Pressure: Absent     ?Pelvic: Cervical exam deferred        ?Extremities: Normal range of motion.     ?Mental Status: Normal mood and affect. Normal behavior. Normal judgment and thought content.  ? ?Assessment and Plan:  ?Pregnancy: G2P1001 at [redacted]w[redacted]d ? ?1. Supervision of other normal pregnancy, antepartum   ?2. Alpha thalassemia silent carrier   ?3. [redacted] weeks gestation of pregnancy   ? ? ?-has signed BTL and VBAC ?-[place] normal today at MFM ?-updated pediatrician ?-patient would like to start FMLA; she will bring her FMLA papers to office. She can start her leave now if she wants.  ? ?There are no diagnoses linked to this encounter. ?Preterm labor symptoms and general obstetric precautions including but not limited to vaginal bleeding,  contractions, leaking of fluid and fetal movement were reviewed in detail with the patient. ?Please refer to After Visit Summary for other counseling recommendations.  ? ?Return in about 3 weeks (around 02/10/2022), or LROB. ? ?Future Appointments  ?Date Time Provider [place]  ?01/20/2022  3:55 PM Ignacio Lowder, Mervyn Skeeters, CNM [facility]  ? ? ?[place], CNM ? ?

## 2022-01-28 ENCOUNTER — Encounter: Payer: Medicaid Other | Admitting: Obstetrics and Gynecology

## 2022-02-03 ENCOUNTER — Encounter: Payer: Self-pay | Admitting: Student

## 2022-02-10 ENCOUNTER — Encounter: Payer: Medicaid Other | Admitting: Obstetrics and Gynecology

## 2022-02-11 ENCOUNTER — Ambulatory Visit (INDEPENDENT_AMBULATORY_CARE_PROVIDER_SITE_OTHER): Payer: Medicaid Other | Admitting: Student

## 2022-02-11 ENCOUNTER — Other Ambulatory Visit: Payer: Self-pay

## 2022-02-11 VITALS — BP 126/70 | HR 115 | Wt 273.0 lb

## 2022-02-11 DIAGNOSIS — Z3A35 35 weeks gestation of pregnancy: Secondary | ICD-10-CM

## 2022-02-11 DIAGNOSIS — O99210 Obesity complicating pregnancy, unspecified trimester: Secondary | ICD-10-CM

## 2022-02-11 DIAGNOSIS — Z3480 Encounter for supervision of other normal pregnancy, unspecified trimester: Secondary | ICD-10-CM

## 2022-02-11 NOTE — Progress Notes (Signed)
? ?  PRENATAL VISIT NOTE ? ?Subjective:  ?Alison Archer is a 31 y.o. G2P1001 at 68w3dbeing seen today for ongoing prenatal care.  She is currently monitored for the following issues for this low-risk pregnancy and has Supervision of other normal pregnancy, antepartum; Alpha thalassemia silent carrier; and Obesity in pregnancy on their problem list. ? ?Patient reports no complaints.  Contractions: Irregular. Vag. Bleeding: None.  Movement: Present. Denies leaking of fluid.  ? ?The following portions of the patient's history were reviewed and updated as appropriate: allergies, current medications, past family history, past medical history, past social history, past surgical history and problem list.  ? ?Objective:  ? ?Vitals:  ? 02/11/22 1549  ?BP: 126/70  ?Pulse: (!) 115  ?Weight: 273 lb (123.8 kg)  ? ? ?Fetal Status: Fetal Heart Rate (bpm): 156 Fundal Height: 38 cm Movement: Present    ? ?General:  Alert, oriented and cooperative. Patient is in no acute distress.  ?Skin: Skin is warm and dry. No rash noted.   ?Cardiovascular: Normal heart rate noted  ?Respiratory: Normal respiratory effort, no problems with respiration noted  ?Abdomen: Soft, gravid, appropriate for gestational age.  Pain/Pressure: Present     ?Pelvic: Cervical exam deferred        ?Extremities: Normal range of motion.     ?Mental Status: Normal mood and affect. Normal behavior. Normal judgment and thought content.  ? ?Assessment and Plan:  ?Pregnancy: G2P1001 at 33w3d1. Obesity in pregnancy   ?2. [redacted] weeks gestation of pregnancy   ?3. Supervision of other normal pregnancy, antepartum   ?-doing well; still wants BTL; still wants TOLAC. Consents have been signed for both ?-needs pap PP ?-will order a MFM for Uterine size date discrepancy ?-discussed anticipatory guidance for next visit ?Preterm labor symptoms and general obstetric precautions including but not limited to vaginal bleeding, contractions, leaking of fluid and fetal movement were  reviewed in detail with the patient. ?Please refer to After Visit Summary for other counseling recommendations.  ? ?Return in about 1 week (around 02/18/2022), or LROB with any provider. ? ?Future Appointments  ?Date Time Provider [place]?02/18/2022 11:15 AM PrDonnamae JudeMD [facility]? ? ?KaStarr LakeCNM ? ?

## 2022-02-18 ENCOUNTER — Encounter: Payer: Self-pay | Admitting: Family Medicine

## 2022-02-18 ENCOUNTER — Telehealth (INDEPENDENT_AMBULATORY_CARE_PROVIDER_SITE_OTHER): Payer: Medicaid Other | Admitting: Family Medicine

## 2022-02-18 DIAGNOSIS — D563 Thalassemia minor: Secondary | ICD-10-CM

## 2022-02-18 DIAGNOSIS — Z3480 Encounter for supervision of other normal pregnancy, unspecified trimester: Secondary | ICD-10-CM

## 2022-02-18 DIAGNOSIS — Z3A36 36 weeks gestation of pregnancy: Secondary | ICD-10-CM

## 2022-02-18 DIAGNOSIS — O352XX0 Maternal care for (suspected) hereditary disease in fetus, not applicable or unspecified: Secondary | ICD-10-CM

## 2022-02-18 NOTE — Progress Notes (Signed)
? ?OBSTETRICS PRENATAL VIRTUAL VISIT ENCOUNTER NOTE ? ?Provider location: [facility]  ? ?Patient location: Home ? ?I connected with Alison Archer on 02/18/22 at 11:15 AM EDT by MyChart Video Encounter and verified that I am speaking with the correct person using two identifiers. I discussed the limitations, risks, security and privacy concerns of performing an evaluation and management service virtually and the availability of in person appointments. I also discussed with the patient that there may be a patient responsible charge related to this service. The patient expressed understanding and agreed to proceed. ?Subjective:  ?Alison Archer is a 31 y.o. G2P1001 at 21w3dbeing seen today for ongoing prenatal care.  She is currently monitored for the following issues for this low-risk pregnancy and has Supervision of other normal pregnancy, antepartum; Alpha thalassemia silent carrier; and Obesity in pregnancy on their problem list. ? ?Patient reports backache and fatigue.  Contractions: Irritability. Vag. Bleeding: None.  Movement: Present. Denies any leaking of fluid.  ? ?The following portions of the patient's history were reviewed and updated as appropriate: allergies, current medications, past family history, past medical history, past social history, past surgical history and problem list.  ? ?Objective:  ?There were no vitals filed for this visit. ? ?Fetal Status:     Movement: Present    ? ?General:  Alert, oriented and cooperative. Patient is in no acute distress.  ?Respiratory: Normal respiratory effort, no problems with respiration noted  ?Mental Status: Normal mood and affect. Normal behavior. Normal judgment and thought content.  ?Rest of physical exam deferred due to type of encounter ? ?Imaging: ?[place] OB FOLLOW UP ? ?Result Date: 01/20/2022 ?----------------------------------------------------------------------  OBSTETRICS REPORT                       (Signed  Final 01/20/2022 02:56 pm) ---------------------------------------------------------------------- Patient Info  ID #:       [id number]                         D.O.B.:  [birth year](30 yrs)  Name:       Alison Archer                  Visit Date: 01/20/2022 01:59 pm ---------------------------------------------------------------------- Performed By  Attending:        RTama HighMD        Ref. [facility]     [facility] Performed By:     KNathen May      [facility]                                                             [place] [facility]                                                             [facility]  Referred By:      Gabriel Carina CNM ---------------------------------------------------------------------- Orders  #  Description                           Code        Ordered By  1  [place] MFM OB FOLLOW UP                   [id number]    Sander Nephew ----------------------------------------------------------------------  #  Order #                     Accession #                Episode #  1  [id number]                   [id number]                 [id number] ---------------------------------------------------------------------- Indications  Obesity complicating pregnancy, second         O99.212  trimester (pre-gestational BMI 34.7)  Genetic carrier (Silent Carrier alpha          Z14.8  thalassemia)  History of cesarean delivery, currently        O33.219  pregnant  Encounter for other antenatal screening        Z36.2  follow-up  [redacted] weeks gestation of pregnancy                Z3A.32  LR NIPS ---------------------------------------------------------------------- Fetal Evaluation  Num Of Fetuses:         1  Fetal  Heart Rate(bpm):  145  Cardiac Activity:       Observed  Presentation:           Cephalic  Placenta:               Posterior Fundal  P. Cord Insertion:      Previously Visualized  Amniotic Fluid  AFI FV:      Within normal limits  AFI Sum(cm)     %Tile       Largest Pocket(cm)  18.06           67          5.76  RUQ(cm)  RLQ(cm)       LUQ(cm)        LLQ(cm)  5.76          3.55          4.16           4.59 ---------------------------------------------------------------------- Biometry  BPD:      80.8  mm     G. Age:  32w 3d         69  %    CI:        78.72   %    70 - 86                                                          FL/HC:      21.5   %    19.1 - 21.3  HC:       288   mm     G. Age:  31w 5d          7  %    HC/AC:      1.00        0.96 - 1.17  AC:      287.6  mm     G. Age:  32w 5d         64  %    FL/BPD:     76.6   %    71 - 87  FL:       61.9  mm     G. Age:  32w 1d         32  %    FL/AC:      21.5   %    20 - 24  HUM:      53.9  mm     G. Age:  31w 2d         34  %  LV:          3  mm  Est. FW:    1972  gm      4 lb 6 oz     44  % ---------------------------------------------------------------------- OB History  Blood Type:   O+  Gravidity:    2         Term:   1  Living:       1 ---------------------------------------------------------------------- Gestational Age  LMP:           32w 2d        Date:  06/08/21                 EDD:   03/15/22  U/S Today:     32w 2d                                        EDD:   03/15/22  Best:          32w 2d     Det. By:  LMP  (06/08/21)          EDD:   03/15/22 ---------------------------------------------------------------------- Anatomy  Cranium:               Appears normal         LVOT:  Previously seen  Cavum:                 Appears normal         Aortic Arch:            Appears normal  Ventricles:            Appears normal         Ductal Arch:            Previously seen  Choroid Plexus:        Previously seen        Diaphragm:               Appears normal  Cerebellum:            Appears normal         Stomach:                Appears normal, left                                                                        sided  Posterior Fossa:       Appears normal         Abdomen:                Appears normal  Nuchal Fold:           Previously seen        Abdominal Wall:         Previously seen  Face:                  Orbits and profile     Cord Vessels:           Previously seen                         previously seen  Lips:                  Appears normal         Kidneys:                Appear normal  Palate:                Not well visualized    Bladder:                Appears normal  Thoracic:              Previously seen        Spine:                  Previously seen  Heart:                 Previously seen        Upper Extremities:      Previously seen  RVOT:                  Previously seen        Lower Extremities:      Previously seen  Other:  Female gender previously seen. VC, 3VV, 3VTV, NB and lenses,          Heels and left  5th digit prev. visualized. Technically difficult due to          maternal habitus and fetal position. ---------------------------------------------------------------------- Impression  Fetal growth is appropriate for gestational age .Amniotic fluid  is normal and good fetal activity is seen .  Patient does not have gestational diabetes.  Blood pressure  today at her office is 130/65 mmHg. ---------------------------------------------------------------------- Recommendations  Follow-up scans as clinically indicated. ----------------------------------------------------------------------                  Tama High, MD Electronically Signed Final Report   01/20/2022 02:56 pm ----------------------------------------------------------------------  ? ?Assessment and Plan:  ?Pregnancy: G2P1001 at [redacted]w[redacted]d?1. Supervision of other normal pregnancy, antepartum ?Continue routine prenatal care. ?Cultures next visit ?She is unable to  continue her job due to advanced pregnancy. Note given ? ? ?2. Alpha thalassemia silent carrier ? ? ?Preterm labor symptoms and general obstetric precautions including but not limited to vaginal bleed

## 2022-02-22 ENCOUNTER — Ambulatory Visit: Payer: Medicaid Other

## 2022-02-26 ENCOUNTER — Encounter: Payer: Self-pay | Admitting: Radiology

## 2022-03-04 ENCOUNTER — Ambulatory Visit: Payer: Medicaid Other | Attending: Student

## 2022-03-04 ENCOUNTER — Ambulatory Visit: Payer: Medicaid Other | Admitting: *Deleted

## 2022-03-04 ENCOUNTER — Encounter: Payer: Self-pay | Admitting: *Deleted

## 2022-03-04 VITALS — BP 138/78 | HR 102

## 2022-03-04 DIAGNOSIS — Z6834 Body mass index (BMI) 34.0-34.9, adult: Secondary | ICD-10-CM

## 2022-03-04 DIAGNOSIS — O285 Abnormal chromosomal and genetic finding on antenatal screening of mother: Secondary | ICD-10-CM

## 2022-03-04 DIAGNOSIS — O99213 Obesity complicating pregnancy, third trimester: Secondary | ICD-10-CM

## 2022-03-04 DIAGNOSIS — O34219 Maternal care for unspecified type scar from previous cesarean delivery: Secondary | ICD-10-CM | POA: Diagnosis not present

## 2022-03-04 DIAGNOSIS — D563 Thalassemia minor: Secondary | ICD-10-CM | POA: Diagnosis not present

## 2022-03-04 DIAGNOSIS — O99210 Obesity complicating pregnancy, unspecified trimester: Secondary | ICD-10-CM | POA: Diagnosis present

## 2022-03-04 DIAGNOSIS — Z362 Encounter for other antenatal screening follow-up: Secondary | ICD-10-CM

## 2022-03-04 DIAGNOSIS — Z3A38 38 weeks gestation of pregnancy: Secondary | ICD-10-CM

## 2022-03-04 DIAGNOSIS — O26843 Uterine size-date discrepancy, third trimester: Secondary | ICD-10-CM

## 2022-03-11 ENCOUNTER — Other Ambulatory Visit: Payer: Self-pay

## 2022-03-11 ENCOUNTER — Inpatient Hospital Stay (HOSPITAL_COMMUNITY)
Admission: AD | Admit: 2022-03-11 | Discharge: 2022-03-15 | DRG: 805 | Disposition: A | Discharge status: Home / Self Care | Payer: Medicaid Other | Attending: Obstetrics and Gynecology | Admitting: Obstetrics and Gynecology

## 2022-03-11 ENCOUNTER — Encounter (HOSPITAL_COMMUNITY): Payer: Self-pay | Admitting: Obstetrics and Gynecology

## 2022-03-11 DIAGNOSIS — Z3480 Encounter for supervision of other normal pregnancy, unspecified trimester: Secondary | ICD-10-CM

## 2022-03-11 DIAGNOSIS — O134 Gestational [pregnancy-induced] hypertension without significant proteinuria, complicating childbirth: Secondary | ICD-10-CM | POA: Diagnosis present

## 2022-03-11 DIAGNOSIS — Z3A39 39 weeks gestation of pregnancy: Secondary | ICD-10-CM

## 2022-03-11 DIAGNOSIS — O99214 Obesity complicating childbirth: Secondary | ICD-10-CM | POA: Diagnosis present

## 2022-03-11 DIAGNOSIS — O1404 Mild to moderate pre-eclampsia, complicating childbirth: Secondary | ICD-10-CM | POA: Diagnosis present

## 2022-03-11 DIAGNOSIS — O99210 Obesity complicating pregnancy, unspecified trimester: Secondary | ICD-10-CM | POA: Diagnosis present

## 2022-03-11 DIAGNOSIS — O34219 Maternal care for unspecified type scar from previous cesarean delivery: Secondary | ICD-10-CM | POA: Diagnosis present

## 2022-03-11 DIAGNOSIS — O139 Gestational [pregnancy-induced] hypertension without significant proteinuria, unspecified trimester: Secondary | ICD-10-CM | POA: Insufficient documentation

## 2022-03-11 DIAGNOSIS — O1400 Mild to moderate pre-eclampsia, unspecified trimester: Secondary | ICD-10-CM

## 2022-03-11 DIAGNOSIS — Z87891 Personal history of nicotine dependence: Secondary | ICD-10-CM | POA: Diagnosis not present

## 2022-03-11 DIAGNOSIS — O411230 Chorioamnionitis, third trimester, not applicable or unspecified: Secondary | ICD-10-CM | POA: Diagnosis present

## 2022-03-11 DIAGNOSIS — O34211 Maternal care for low transverse scar from previous cesarean delivery: Secondary | ICD-10-CM | POA: Diagnosis not present

## 2022-03-11 DIAGNOSIS — O411290 Chorioamnionitis, unspecified trimester, not applicable or unspecified: Secondary | ICD-10-CM

## 2022-03-11 DIAGNOSIS — D563 Thalassemia minor: Secondary | ICD-10-CM | POA: Diagnosis present

## 2022-03-11 LAB — CBC
HCT: 36.6 % (ref 36.0–46.0)
HCT: 34.3 % — ABNORMAL LOW (ref 36.0–46.0)
Hemoglobin: 11.3 g/dL — ABNORMAL LOW (ref 12.0–15.0)
Hemoglobin: 10.9 g/dL — ABNORMAL LOW (ref 12.0–15.0)
MCH: 27.6 pg (ref 26.0–34.0)
MCH: 28.2 pg (ref 26.0–34.0)
MCHC: 30.9 g/dL (ref 30.0–36.0)
MCHC: 31.8 g/dL (ref 30.0–36.0)
MCV: 89.3 fL (ref 80.0–100.0)
MCV: 88.9 fL (ref 80.0–100.0)
Platelets: 311 10*3/uL (ref 150–400)
Platelets: 280 10*3/uL (ref 150–400)
RBC: 4.1 MIL/uL (ref 3.87–5.11)
RBC: 3.86 MIL/uL — ABNORMAL LOW (ref 3.87–5.11)
RDW: 14.7 % (ref 11.5–15.5)
RDW: 14.8 % (ref 11.5–15.5)
WBC: 8.5 10*3/uL (ref 4.0–10.5)
WBC: 9.5 10*3/uL (ref 4.0–10.5)
nRBC: 0 % (ref 0.0–0.2)
nRBC: 0 % (ref 0.0–0.2)

## 2022-03-11 LAB — URINALYSIS, ROUTINE W REFLEX MICROSCOPIC
Bilirubin Urine: NEGATIVE
Glucose, UA: NEGATIVE mg/dL
Hgb urine dipstick: NEGATIVE
Ketones, ur: NEGATIVE mg/dL
Leukocytes,Ua: NEGATIVE
Nitrite: NEGATIVE
Protein, ur: 30 mg/dL — AB
Specific Gravity, Urine: 1.016 (ref 1.005–1.030)
pH: 6 (ref 5.0–8.0)

## 2022-03-11 LAB — COMPREHENSIVE METABOLIC PANEL
ALT: 17 U/L (ref 0–44)
AST: 20 U/L (ref 15–41)
Albumin: 2.6 g/dL — ABNORMAL LOW (ref 3.5–5.0)
Alkaline Phosphatase: 139 U/L — ABNORMAL HIGH (ref 38–126)
Anion gap: 6 (ref 5–15)
BUN: 5 mg/dL — ABNORMAL LOW (ref 6–20)
CO2: 23 mmol/L (ref 22–32)
Calcium: 8.9 mg/dL (ref 8.9–10.3)
Chloride: 109 mmol/L (ref 98–111)
Creatinine, Ser: 0.59 mg/dL (ref 0.44–1.00)
GFR, Estimated: >60 mL/min (ref >60)
Glucose, Bld: 136 mg/dL — ABNORMAL HIGH (ref 70–99)
Potassium: 3.8 mmol/L (ref 3.5–5.1)
Sodium: 138 mmol/L (ref 135–145)
Total Bilirubin: 0.5 mg/dL (ref 0.3–1.2)
Total Protein: 6.3 g/dL — ABNORMAL LOW (ref 6.5–8.1)

## 2022-03-11 LAB — PROTEIN / CREATININE RATIO, URINE
Creatinine, Urine: 143.27 mg/dL
Protein Creatinine Ratio: 0.31 mg/mg{Cre} — ABNORMAL HIGH (ref 0.00–0.15)
Total Protein, Urine: 44 mg/dL

## 2022-03-11 LAB — TYPE AND SCREEN
ABO/RH(D): O POS
Antibody Screen: NEGATIVE

## 2022-03-11 MED ORDER — OXYCODONE-ACETAMINOPHEN 5-325 MG PO TABS: 1.0000 | ORAL_TABLET | ORAL | Status: DC | PRN

## 2022-03-11 MED ORDER — SOD CITRATE-CITRIC ACID 500-334 MG/5ML PO SOLN: 30.0000 mL | ORAL | Status: DC | PRN

## 2022-03-11 MED ORDER — LACTATED RINGERS IV SOLN: INTRAVENOUS | Status: DC

## 2022-03-11 MED ORDER — LACTATED RINGERS IV SOLN: 500.0000 mL | INTRAVENOUS | Status: DC | PRN

## 2022-03-11 MED ORDER — FENTANYL CITRATE (PF) 100 MCG/2ML IJ SOLN: 50.0000 ug | Freq: Once | INTRAMUSCULAR | Status: DC

## 2022-03-11 MED ORDER — OXYTOCIN-SODIUM CHLORIDE 30-0.9 UT/500ML-% IV SOLN
1.0000 m[IU]/min | INTRAVENOUS | Status: DC
  Administered 2022-03-11: 1 m[IU]/min via INTRAVENOUS
  Administered 2022-03-13: 2 m[IU]/min via INTRAVENOUS

## 2022-03-11 MED ORDER — OXYTOCIN BOLUS FROM INFUSION
333.0000 mL | Freq: Once | INTRAVENOUS | Status: AC
  Administered 2022-03-13: 333 mL via INTRAVENOUS

## 2022-03-11 MED ORDER — FENTANYL CITRATE (PF) 100 MCG/2ML IJ SOLN
50.0000 ug | INTRAMUSCULAR | Status: DC | PRN
  Administered 2022-03-11: 100 ug via INTRAVENOUS
  Administered 2022-03-11: 50 ug via INTRAVENOUS
  Administered 2022-03-11: 100 ug via INTRAVENOUS
  Filled 2022-03-11 (×6): qty 2

## 2022-03-11 MED ORDER — OXYTOCIN-SODIUM CHLORIDE 30-0.9 UT/500ML-% IV SOLN
2.5000 [IU]/h | INTRAVENOUS | Status: DC
  Filled 2022-03-11 (×2): qty 500

## 2022-03-11 MED ORDER — TERBUTALINE SULFATE 1 MG/ML IJ SOLN: 0.2500 mg | Freq: Once | INTRAMUSCULAR | Status: DC | PRN

## 2022-03-11 MED ORDER — LIDOCAINE HCL (PF) 1 % IJ SOLN
30.0000 mL | INTRAMUSCULAR | Status: DC | PRN
Start: 2022-03-11 — End: 2022-03-13

## 2022-03-11 MED ORDER — ACETAMINOPHEN 325 MG PO TABS: 650.0000 mg | ORAL_TABLET | ORAL | Status: DC | PRN

## 2022-03-11 MED ORDER — OXYCODONE-ACETAMINOPHEN 5-325 MG PO TABS: 2.0000 | ORAL_TABLET | ORAL | Status: DC | PRN

## 2022-03-11 MED ORDER — ONDANSETRON HCL 4 MG/2ML IJ SOLN
4.0000 mg | Freq: Four times a day (QID) | INTRAMUSCULAR | Status: DC | PRN
  Administered 2022-03-12: 4 mg via INTRAVENOUS
  Filled 2022-03-11: qty 2

## 2022-03-11 NOTE — H&P (Signed)
?OBSTETRIC ADMISSION HISTORY AND PHYSICAL ? ?Alison Archer is a 31 y.o. female G2P1001 with IUP at 58w3dby LMP presenting for TOLAC/IOL for new diagnosis of gHTN . She reports +FMs, No LOF, no VB, no blurry vision, headaches or peripheral edema, and RUQ pain.  She plans on  breast and bottle feeding. She request BTL for birth control (consent signed on 12/17/2021) ?She received her prenatal care at  [facility]  ? ?Dating: By LMP --->  Estimated Date of Delivery: 03/15/22 ? ?Sono:   ? ?'@[redacted]w[redacted]d'$ , CWD, normal anatomy, cephalic presentation, posterior fundal placental lie, 3498g, 66% EFW ? ? ?Prenatal History/Complications:  ?Carrier for alpha thalassemia ?New onset gHTN ? ?Past Medical History: ?Past Medical History:  ?Diagnosis Date  ? Asthma   ? exercise induced  ? [place]   ? febrile as a child  ? ? ?Past Surgical History: ?Past Surgical History:  ?Procedure Laterality Date  ? CESAREAN SECTION N/A 04/07/2013  ? Procedure: Primary cesarean section with delivery of baby boy at 146  Apgars 4/7.;  Surgeon: CLavonia Drafts MD;  [facility];  Service: Obstetrics;  Laterality: N/A;  ? NO PAST SURGERIES    ? ? ?Obstetrical History: ?OB History   ? ? Gravida  ?2  ? Para  ?1  ? Term  ?1  ? Preterm  ?0  ? AB  ?0  ? Living  ?1  ?  ? ? SAB  ?0  ? IAB  ?0  ? Ectopic  ?0  ? Multiple  ?0  ? Live Births  ?1  ?   ?  ?  ? ? ?Social History ?Social History  ? ?Socioeconomic History  ? Marital status: Single  ?  Spouse name: Not on file  ? Number of children: 1  ? Years of education: Not on file  ? Highest education level: High school graduate  ?Occupational History  ? Not on file  ?Tobacco Use  ? Smoking status: Former  ?  Packs/day: 0.25  ?  Types: Cigarettes  ?  Quit date: 01/09/2020  ?  Years since quitting: 2.1  ? Smokeless tobacco: Never  ?Vaping Use  ? Vaping Use: Never used  ?Substance and Sexual Activity  ? Alcohol use: No  ? Drug use: Not Currently  ?  Types: Marijuana  ?  Comment: last use 2 years ago  ?  Sexual activity: Yes  ?  Birth control/protection: None  ?Other Topics Concern  ? Not on file  ?Social History Narrative  ? Not on file  ? ?Social Determinants of Health  ? ?Financial Resource Strain: Low Risk   ? Difficulty of Paying Living Expenses: Not hard at all  ?Food Insecurity: No Food Insecurity  ? Worried About RCharity fundraiserin the Last Year: Never true  ? Ran Out of Food in the Last Year: Never true  ?Transportation Needs: No Transportation Needs  ? Lack of Transportation (Medical): No  ? Lack of Transportation (Non-Medical): No  ?Physical Activity: Sufficiently Active  ? Days of Exercise per Week: 7 days  ? Minutes of Exercise per Session: 30 min  ?Stress: No Stress Concern Present  ? Feeling of Stress : Not at all  ?Social Connections: Moderately Isolated  ? Frequency of Communication with Friends and Family: More than three times a week  ? Frequency of Social Gatherings with Friends and Family: More than three times a week  ? Attends Religious Services: Never  ? Active Member  of Clubs or Organizations: No  ? Attends Archivist Meetings: Never  ? Marital Status: Living with partner  ? ? ?Family History: ?Family History  ?Problem Relation Age of Onset  ? Hypertension Mother   ? Hypertension Father   ? Asthma Sister   ? Hypertension Sister   ? Diabetes Maternal Grandmother   ? ? ?Allergies: ?No Known Allergies ? ?Medications Prior to Admission  ?Medication Sig Dispense Refill Last Dose  ? Blood Pressure Monitoring (BLOOD PRESSURE MONITOR AUTOMAT) DEVI 1 Device by Does not apply route daily. Automatic blood pressure cuff regular size. To monitor blood pressure regularly at home. ICD-10 code: O09.90 1 each 0   ? Misc. Devices (GOJJI WEIGHT SCALE) MISC 1 Device by Does not apply route daily as needed. To weight self daily as needed at home. ICD-10 code: Z34.90 1 each 0   ? prenatal vitamin w/FE, FA (PRENATAL 1 + 1) 27-1 MG TABS tablet Take 1 tablet by mouth daily at 12 noon.     ? ? ? ?Review  of Systems  ? ?All systems reviewed and negative except as stated in HPI ? ?Blood pressure 124/79, pulse (!) 106, temperature 98.5 ?F (36.9 ?C), temperature source Oral, resp. rate 19, height 5' 5.5" (1.664 m), weight 125.3 kg, last menstrual period 06/08/2021, SpO2 100 %. ?General appearance: alert ?Lungs: clear to auscultation bilaterally ?Heart: regular rate and rhythm ?Abdomen: soft, non-tender; bowel sounds normal ?Extremities: Homans sign is negative, no sign of DVT ?Presentation: cephalic by BSUS ?Fetal monitoringBaseline: 150 bpm, Variability: Good {> 6 bpm), Accelerations: Reactive, and Decelerations: Absent ?Uterine activity irregular ?  ? ? ?Prenatal labs: ?ABO, Rh: O/Positive/-- (10/10 1339) ?Antibody: Negative (10/10 1339) ?Rubella: 3.57 (10/10 1339) ?RPR: Non Reactive (01/26 0834)  ?HBsAg: Negative (10/10 1339)  ?HIV: Non Reactive (01/26 0834)  ?GBS:   unknown, collected today ?1 hr Glucola normal ?Genetic screening  alpha thalassemia carrier, LR NIPS ?Anatomy US normal ? ?Prenatal Transfer Tool  ?Maternal Diabetes: No ?Genetic Screening: Normal ?Maternal Ultrasounds/Referrals: Normal ?Fetal Ultrasounds or other Referrals:  None ?Maternal Substance Abuse:  No ?Significant Maternal Medications:  None ?Significant Maternal Lab Results: Other: GBS unknown ? ?No results found for this or any previous visit (from the past 24 hour(s)). ? ?Patient Active Problem List  ? Diagnosis Date Noted  ? Obesity in pregnancy 12/30/2021  ? Alpha thalassemia silent carrier 09/15/2021  ? Supervision of other normal pregnancy, antepartum 08/31/2021  ? ? ?Assessment/Plan:  ?Alison Archer is a 31 y.o. G2P1001 at 82w3dhere for TOLAC/IOL for gHTN ? ?#Labor: Being admitted for TOLAC/IOL for gHTN. Cervix 1 cm dilated. Cooks balloon placed with speculum guidance without difficulty. Tolerated well by patient and fetus. Will plan to start low dose pitocin as well ?#Pain: Plans epidural ?#FWB: Cat I ?#ID:  GBS unknown, swab sent at  admission ?#MOF: both ?#MOC:BTL ?#Circ:  N/A ? ?#gHTN--> preE without SF ?BP elevated in MAU at 143/87. Had prior BP in 140s in clinic. Denies headaches or vision changes. PreE labs back and p:c 0.31. Continue with IOL. ? ?#TOLAC ?Discussed with patient at depth regarding desire for VBAC. Discussed regarding benefits and risks in detail including risk of uterine rupture. Patient expresses understanding. Would like to proceed with TOLAC at this time.  ? ?ARenard Matter MD  ?03/11/2022, 11:48 AM ? ? ? ? ?

## 2022-03-11 NOTE — MAU Note (Signed)
...  Alison Archer is a 31 y.o. at 74w3dhere in MAU reporting: Elevated BP's at home since yesterday. She states yesterday it was 165/95 and today it was 173/112. Denies HA, visual disturbances, and RUQ/epigastric pain. +FM. No VB or LOF.  ? ?Pain score: Denies pain but states she has been feeling intermittent vaginal pressure for the past two weeks that increases when she walks. ? ?FHT: 152 doppler ?Lab orders placed from triage: UA ? ?

## 2022-03-11 NOTE — Progress Notes (Signed)
Alison Archer is a 31 y.o. G2P1001 at 48w3dadmitted for TOLAC/IOL due to gHTN. ? ?Subjective: ?Patient's pain has increased with contractions and she requests PRN IV pain med. Overall, patient is doing okay. ? ?Objective: ?BP (!) 154/87   Pulse (!) 102   Temp 98.9 ?F (37.2 ?C) (Oral)   Resp 18   Ht 5' 5.5" (1.664 m)   Wt 125.3 kg   LMP 06/08/2021   SpO2 100%   BMI 45.28 kg/m?  ? ?Vitals:  ? 03/11/22 1900 03/11/22 2000 03/11/22 2032 03/11/22 2100  ?BP: 130/74 (!) 141/85 (!) 154/87 (!) 144/79  ?Pulse: (!) 104 96 (!) 102 100  ?Resp: 18     ?Temp: 98.9 ?F (37.2 ?C)     ?TempSrc: Oral     ?SpO2:      ?Weight:      ?Height:      ?  ? ?FHT:  FHR: 145 bpm, variability: moderate,  accelerations:  Present,  decelerations:  Absent ?UC:   irregular ?SVE:   Dilation: 1 ?Effacement (%): Thick ?Station: -3 ?Exam by:: Dr. DCy Archer? ?Labs: ?Lab Results  ?Component Value Date  ? WBC 8.5 03/11/2022  ? HGB 11.3 (L) 03/11/2022  ? HCT 36.6 03/11/2022  ? MCV 89.3 03/11/2022  ? PLT 311 03/11/2022  ? ?PrCr ratio: 0.31 ? ?Assessment / Plan: ?Alison Archer is a 31y.o. G2P1001 at 353w3ddmitted for TOLAC/IOL due to gHTN that has progressed to preeclampsia without SF. ? ?Labor: Progressing on Pitocin and FHT is stable - will continue to increase Pitocin as needed and re-assess when patient is in good pattern. Will also continue to reposition patient and try exercises. ?Fetal Wellbeing:  Category I ?Pain Control:  Planning epidural and giving pain meds PRN ?I/D:   GBS pending ?Anticipated MOD:   VBAC ? ?Preelcampsia without SF: BP continues to be elevated in the 140s-150s/70s-80s. Pr/Cr 0.31 this afternoon. Will continue to monitor BP closely and monitor for headaches or vision changes. ? ?Alison Archer ?03/11/2022, 9:13 PM ? ? ?

## 2022-03-11 NOTE — Progress Notes (Signed)
Meets criteria today for gestational hypertension.  Asymptomatic.  Planning Tolac, consent signed.  Dr. Cy Blamer notified. ?Vertex confirmed by BSUS. ?

## 2022-03-12 ENCOUNTER — Inpatient Hospital Stay (HOSPITAL_COMMUNITY): Payer: Medicaid Other | Admitting: Anesthesiology

## 2022-03-12 LAB — CBC
HCT: 37.3 % (ref 36.0–46.0)
Hemoglobin: 11.3 g/dL — ABNORMAL LOW (ref 12.0–15.0)
MCH: 27.2 pg (ref 26.0–34.0)
MCHC: 30.3 g/dL (ref 30.0–36.0)
MCV: 89.7 fL (ref 80.0–100.0)
Platelets: 276 10*3/uL (ref 150–400)
RBC: 4.16 MIL/uL (ref 3.87–5.11)
RDW: 14.6 % (ref 11.5–15.5)
WBC: 10.7 10*3/uL — ABNORMAL HIGH (ref 4.0–10.5)
nRBC: 0 % (ref 0.0–0.2)

## 2022-03-12 LAB — COMPREHENSIVE METABOLIC PANEL
ALT: 15 U/L (ref 0–44)
AST: 17 U/L (ref 15–41)
Albumin: 2.5 g/dL — ABNORMAL LOW (ref 3.5–5.0)
Alkaline Phosphatase: 136 U/L — ABNORMAL HIGH (ref 38–126)
Anion gap: 6 (ref 5–15)
BUN: <5 mg/dL — ABNORMAL LOW (ref 6–20)
CO2: 23 mmol/L (ref 22–32)
Calcium: 8.8 mg/dL — ABNORMAL LOW (ref 8.9–10.3)
Chloride: 106 mmol/L (ref 98–111)
Creatinine, Ser: 0.62 mg/dL (ref 0.44–1.00)
GFR, Estimated: >60 mL/min (ref >60)
Glucose, Bld: 127 mg/dL — ABNORMAL HIGH (ref 70–99)
Potassium: 4 mmol/L (ref 3.5–5.1)
Sodium: 135 mmol/L (ref 135–145)
Total Bilirubin: 0.7 mg/dL (ref 0.3–1.2)
Total Protein: 6.1 g/dL — ABNORMAL LOW (ref 6.5–8.1)

## 2022-03-12 LAB — GC/CHLAMYDIA PROBE AMP (~~LOC~~) NOT AT ARMC
Chlamydia: NEGATIVE
Comment: NEGATIVE
Comment: NORMAL
Neisseria Gonorrhea: NEGATIVE

## 2022-03-12 LAB — RPR: RPR Ser Ql: NONREACTIVE

## 2022-03-12 MED ORDER — EPHEDRINE 5 MG/ML INJ: 10.0000 mg | INTRAVENOUS | Status: DC | PRN

## 2022-03-12 MED ORDER — PENICILLIN G POT IN DEXTROSE 60000 UNIT/ML IV SOLN
3.0000 10*6.[IU] | INTRAVENOUS | Status: DC
  Administered 2022-03-12 – 2022-03-13 (×4): 3 10*6.[IU] via INTRAVENOUS
  Filled 2022-03-12 (×4): qty 50

## 2022-03-12 MED ORDER — SODIUM CHLORIDE 0.9 % IV SOLN
5.0000 10*6.[IU] | Freq: Once | INTRAVENOUS | Status: AC
  Administered 2022-03-12: 5 10*6.[IU] via INTRAVENOUS
  Filled 2022-03-12: qty 5

## 2022-03-12 MED ORDER — PHENYLEPHRINE 80 MCG/ML (10ML) SYRINGE FOR IV PUSH (FOR BLOOD PRESSURE SUPPORT)
80.0000 ug | PREFILLED_SYRINGE | INTRAVENOUS | Status: DC | PRN
  Filled 2022-03-12: qty 10

## 2022-03-12 MED ORDER — FENTANYL-BUPIVACAINE-NACL 0.5-0.125-0.9 MG/250ML-% EP SOLN
EPIDURAL | Status: DC | PRN
  Administered 2022-03-12: 12 mL/h via EPIDURAL

## 2022-03-12 MED ORDER — LACTATED RINGERS IV SOLN: 500.0000 mL | Freq: Once | INTRAVENOUS | Status: DC

## 2022-03-12 MED ORDER — FENTANYL-BUPIVACAINE-NACL 0.5-0.125-0.9 MG/250ML-% EP SOLN
12.0000 mL/h | EPIDURAL | Status: DC | PRN
  Administered 2022-03-12 – 2022-03-13 (×3): 12 mL/h via EPIDURAL
  Filled 2022-03-12 (×3): qty 250

## 2022-03-12 MED ORDER — LIDOCAINE HCL (PF) 1 % IJ SOLN
INTRAMUSCULAR | Status: DC | PRN
  Administered 2022-03-12: 2 mL via EPIDURAL
  Administered 2022-03-12: 10 mL via EPIDURAL

## 2022-03-12 MED ORDER — DIPHENHYDRAMINE HCL 50 MG/ML IJ SOLN: 12.5000 mg | INTRAMUSCULAR | Status: DC | PRN

## 2022-03-12 NOTE — Progress Notes (Signed)
Charrise S Pandolfi is a 31 y.o. G2P1001 at 69w4dadmitted for TOLAC/induction of labor due to gHTN now found to be preE without SF. ? ?Subjective: ?Reports she feels comfortable with epidural in place. Reports she is still very motivated to have a VBAC if possible ? ?Objective: ?BP (!) 131/55   Pulse 86   Temp 98.8 ?F (37.1 ?C) (Oral)   Resp 16   Ht 5' 5.5" (1.664 m)   Wt 125.3 kg   LMP 06/08/2021   SpO2 100%   BMI 45.28 kg/m?  ?I/O last 3 completed shifts: ?In: -  ?Out: 2820 [Urine:2520; Emesis/NG output:300] ?No intake/output data recorded. ? ?FHT:  FHR: 140 bpm, variability: moderate,  accelerations:  Present,  decelerations:  Absent ?UC:   regular, every 1-2 minutes ?SVE:   Dilation: 5.5 ?Effacement (%): 60 ?Station: -2 ?Exam by:: ARenard Matter? ?Labs: ?Lab Results  ?Component Value Date  ? WBC 10.7 (H) 03/12/2022  ? HGB 11.3 (L) 03/12/2022  ? HCT 37.3 03/12/2022  ? MCV 89.7 03/12/2022  ? PLT 276 03/12/2022  ? ? ?Assessment / Plan: ?G2P1001 at 362w4ddmitted for TOLAC/induction of labor due to gHTN now found to be preE without SF. ? ?Labor:  patient with adequate contractions mostly consistently since ~0900, although had some periods where MVUs were less than 180. Cervix remains at ~5 cm for ~13 hours. Fetus is Cat I tracing. Head has descended with spinning babies techniques from -3 to -2 although with different examiners. Patient would like to try VBAC still and feels motivated to continue trying. We discussed that since fetal status reassuring we can try. We discussed pitocin break and restart since she has been on pitocin for >24 hours. Patient would like to try. Pitocin off till 12:30 AM and plan to recheck and restart at that time. We discussed that if cervix unchanged with adequate contractions for atleast 4 hours after pitocin break could be arrest of dilation and at that time cesarean delivery may be recommended. Patient expressed understanding.  ?Preeclampsia:   preE without SF. BP  in 120s-130s/60s  throughout day. PreE Labs at ~5PM with normal LFTs and platelets. Will continue to monitor ?Fetal Wellbeing:  Category I ?Pain Control:  Epidural ?I/D:   GBS unk, Pos in prior pregnancy, on PCN ? ?AnRenard MatterMD, MPH ?OB Fellow, Faculty Practice ? ?

## 2022-03-12 NOTE — Progress Notes (Signed)
Labor Progress Note ?Trenita S Brener is a 31 y.o. G2P1001 at 76w4dpresented for IOL/TOLAC due to gestational hypertension>mild pre-e.  ? ?S: Doing well.  ? ?O:  ?BP (!) 152/94   Pulse 100   Temp 98.8 ?F (37.1 ?C) (Oral)   Resp 17   Ht 5' 5.5" (1.664 m)   Wt 125.3 kg   LMP 06/08/2021   SpO2 100%   BMI 45.28 kg/m?  ?EFM: 150/mod/15x15/none ? ?CVE: Dilation: 5 ?Effacement (%): 60 ?Station: -3 ?Presentation: Vertex ?Exam by:: Dr. BHiginio Plan?+ scalp stim with exam  ? ?A&P: 31y.o. G2P1001 378w4d?#Labor: Unchanged on this check as well. After verbal consent again with partner present, performed AROM of forebag with moderate amount of clear fluid. Head became more applied after this, still at -3.  ?#Pain: Epidural  ?#FWB: Cat I  ?#GBS  unk, PCN on  ? ?#Pre-eclampsia, mild: no severe range BP's. No symptoms. Cont to monitor. Recheck labs this evening.  ? ?SaPatriciaann ClanDO ?5:07 PM  ?

## 2022-03-12 NOTE — Anesthesia Preprocedure Evaluation (Signed)
Anesthesia Evaluation  ?Patient identified by MRN, date of birth, ID band ?Patient awake ? ? ? ?Reviewed: ?Allergy & Precautions, Patient's Chart, lab work & pertinent test results ? ?Airway ?Mallampati: III ? ?TM Distance: >3 FB ?Neck ROM: Full ? ? ? Dental ?no notable dental hx. ? ?  ?Pulmonary ?asthma (exercise-induced) , former smoker,  ?  ?Pulmonary exam normal ?breath sounds clear to auscultation ? ? ? ? ? ? Cardiovascular ?hypertension (preE), Normal cardiovascular exam ?Rhythm:Regular Rate:Normal ? ? ?  ?Neuro/Psych ?Seizures - (febrile seizures as child),  negative psych ROS  ? GI/Hepatic ?negative GI ROS, Neg liver ROS,   ?Endo/Other  ?Morbid obesityBMI 45 ? Renal/GU ?negative Renal ROS  ?negative genitourinary ?  ?Musculoskeletal ?negative musculoskeletal ROS ?(+)  ? Abdominal ?(+) + obese,   ?Peds ?negative pediatric ROS ?(+)  Hematology ? ?(+) Blood dyscrasia, anemia , Hb 10.9, plt 280   ?Anesthesia Other Findings ? ? Reproductive/Obstetrics ?(+) Pregnancy ?TOLAC- section 2014 w/ epidural d/t fetal intolerance of labor ? ?  ? ? ? ? ? ? ? ? ? ? ? ? ? ?  ?  ? ? ? ? ? ? ? ? ?Anesthesia Physical ?Anesthesia Plan ? ?ASA: 3 ? ?Anesthesia Plan: Epidural  ? ?Post-op Pain Management:   ? ?Induction:  ? ?PONV Risk Score and Plan: 2 ? ?Airway Management Planned: Natural Airway ? ?Additional Equipment: None ? ?Intra-op Plan:  ? ?Post-operative Plan:  ? ?Informed Consent: I have reviewed the patients History and Physical, chart, labs and discussed the procedure including the risks, benefits and alternatives for the proposed anesthesia with the patient or authorized representative who has indicated his/her understanding and acceptance.  ? ? ? ? ? ?Plan Discussed with:  ? ?Anesthesia Plan Comments:   ? ? ? ? ? ? ?Anesthesia Quick Evaluation ? ?

## 2022-03-12 NOTE — Progress Notes (Signed)
Last checked two hours ago.  ? ?Patient has been about 5 cm all day with station still ballotable. Previously SROM'd with IUPC in place, but has a forebag. She has had adequate contractions with pit all day for the majority of the time (perhaps 1 hour total not adequate).  ? ?Discussed current arrest despite adequate contractions. Would like to perform AROM on the remaining forebag (but not an ideal station), however do not have many other options at that point to possibly continue with a vaginal delivery. She has been in numerous positions to facilitate fetal positioning into the pelvis already.  ? ?Discussed the low but possible risk of cord prolapse and possibility of STAT C/S in the setting, but hopeful this will not be the case.  ? ?She is aware of the risks and would like to proceed when her partner returns. Plan to re-check and AROM at that time.  ? ?Patriciaann Clan, DO  ?

## 2022-03-12 NOTE — Progress Notes (Signed)
Patient ID: Alison Archer, female   DOB: 09-Feb-1991, 31 y.o.   MRN: [id number] ?Doing well ? ?Vitals:  ? 03/11/22 2332 03/12/22 0000 03/12/22 0025 03/12/22 0032  ?BP: 123/71 124/73  131/74  ?Pulse: 90 88  85  ?Resp:   18   ?Temp:   98.9 ?F (37.2 ?C)   ?TempSrc:   Oral   ?SpO2:      ?Weight:      ?Height:      ? ?FHR 130-140 with accels ?UCs are irregular ? ?Dilation: 1.5 ?Effacement (%): 60 ?Station: -3 ?Presentation: Vertex ?Exam by:: Hansel Feinstein, CNM ? ?Balloon in place, cervix thinning around balloon ? ? ?

## 2022-03-12 NOTE — Progress Notes (Signed)
Labor Progress Note ?Alison Archer is a 31 y.o. G2P1001 at 60w4dpresented for IOL due to gestational hypertension.  ? ?S: Doing well. Comfortable with epidural.  ? ?O:  ?BP 139/80   Pulse 90   Temp 98.7 ?F (37.1 ?C) (Oral)   Resp 16   Ht 5' 5.5" (1.664 m)   Wt 125.3 kg   LMP 06/08/2021   SpO2 100%   BMI 45.28 kg/m?  ?EFM: 150/mod/15x15/none ? ?CVE: Dilation: 5 ?Effacement (%): 60 ?Station: -3 ?Presentation: Vertex ?Exam by:: Dr. BHiginio Plan? ? ?A&P: 31y.o. G2P1001 [redacted]w[redacted]d?#Labor: Unchanged from recent RN check. Still quite ballotable with a forebag present. Placed into flying cowgirl position to help fetal positioning into the pelvis. Already had side lying release/jiggle performed by RN. Plan to recheck in the next few hours and discuss forebag AROM. Cont pit as is.  ?#Pain: epidural in place  ?#FWB: Cat I  ?#GBS  unknown with culture pending. History of GBS positive in previous pregnancy, will give PCN. ? ?#Gestational hypertension: BP mild range. No symptoms. Cont to monitor.  ? ?SaPatriciaann ClanDO ?1:55 PM  ?

## 2022-03-12 NOTE — Progress Notes (Signed)
Patient ID: Alison Archer, female   DOB: 07-19-1991, 31 y.o.   MRN: [id number] ?RN reports patient had SROM at 0450 ? ?Foley balloon came out at 0530 ? ?AVSS ? ?FHR reassuring category I ?UCs q2-5mn ? ?Dilation: 4 ?Effacement (%): 60 ?Station: Ballotable ?Presentation: Vertex ?Exam by:: MHansel Feinstein CNM ? ?Feral vertex very high ?IUPC inserted ? ?180-190MVU/131m ?Adequate labor ?

## 2022-03-12 NOTE — Anesthesia Procedure Notes (Signed)
Epidural ?Patient location during procedure: OB ?Start time: 03/12/2022 1:48 AM ?End time: 03/12/2022 1:57 AM ? ?Staffing ?Anesthesiologist: Pervis Hocking, DO ?Performed: anesthesiologist  ? ?Preanesthetic Checklist ?Completed: patient identified, IV checked, risks and benefits discussed, monitors and equipment checked, pre-op evaluation and timeout performed ? ?Epidural ?Patient position: sitting ?Prep: DuraPrep and site prepped and draped ?Patient monitoring: continuous pulse ox, blood pressure, heart rate and cardiac monitor ?Approach: midline ?Location: L3-L4 ?Injection technique: LOR air ? ?Needle:  ?Needle type: Tuohy  ?Needle gauge: 17 G ?Needle length: 9 cm ?Needle insertion depth: 9 cm ?Catheter type: closed end flexible ?Catheter size: 19 Gauge ?Catheter at skin depth: 15 cm ?Test dose: negative ? ?Assessment ?Sensory level: T8 ?Events: blood not aspirated, injection not painful, no injection resistance, no paresthesia and negative IV test ? ?Additional Notes ?Patient identified. Risks/Benefits/Options discussed with patient including but not limited to bleeding, infection, nerve damage, paralysis, failed block, incomplete pain control, headache, blood pressure changes, nausea, vomiting, reactions to medication both or allergic, itching and postpartum back pain. Confirmed with bedside nurse the patient's most recent platelet count. Confirmed with patient that they are not currently taking any anticoagulation, have any bleeding history or any family history of bleeding disorders. Patient expressed understanding and wished to proceed. All questions were answered. Sterile technique was used throughout the entire procedure. Please see nursing notes for vital signs. Test dose was given through epidural catheter and negative prior to continuing to dose epidural or start infusion. Warning signs of high block given to the patient including shortness of breath, tingling/numbness in hands, complete motor  block, or any concerning symptoms with instructions to call for help. Patient was given instructions on fall risk and not to get out of bed. All questions and concerns addressed with instructions to call with any issues or inadequate analgesia.  Reason for block:procedure for pain ? ? ? ?

## 2022-03-13 ENCOUNTER — Encounter (HOSPITAL_COMMUNITY): Payer: Self-pay | Admitting: Obstetrics and Gynecology

## 2022-03-13 DIAGNOSIS — O1400 Mild to moderate pre-eclampsia, unspecified trimester: Secondary | ICD-10-CM

## 2022-03-13 DIAGNOSIS — O1404 Mild to moderate pre-eclampsia, complicating childbirth: Secondary | ICD-10-CM

## 2022-03-13 DIAGNOSIS — O411230 Chorioamnionitis, third trimester, not applicable or unspecified: Secondary | ICD-10-CM

## 2022-03-13 DIAGNOSIS — O34211 Maternal care for low transverse scar from previous cesarean delivery: Secondary | ICD-10-CM

## 2022-03-13 DIAGNOSIS — Z3A39 39 weeks gestation of pregnancy: Secondary | ICD-10-CM

## 2022-03-13 DIAGNOSIS — O34219 Maternal care for unspecified type scar from previous cesarean delivery: Secondary | ICD-10-CM

## 2022-03-13 DIAGNOSIS — O411290 Chorioamnionitis, unspecified trimester, not applicable or unspecified: Secondary | ICD-10-CM

## 2022-03-13 HISTORY — DX: Mild to moderate pre-eclampsia, unspecified trimester: O14.00

## 2022-03-13 LAB — COMPREHENSIVE METABOLIC PANEL
ALT: 13 U/L (ref 0–44)
AST: 16 U/L (ref 15–41)
Albumin: 2.3 g/dL — ABNORMAL LOW (ref 3.5–5.0)
Alkaline Phosphatase: 129 U/L — ABNORMAL HIGH (ref 38–126)
Anion gap: 9 (ref 5–15)
BUN: <5 mg/dL — ABNORMAL LOW (ref 6–20)
CO2: 21 mmol/L — ABNORMAL LOW (ref 22–32)
Calcium: 8.6 mg/dL — ABNORMAL LOW (ref 8.9–10.3)
Chloride: 104 mmol/L (ref 98–111)
Creatinine, Ser: 0.68 mg/dL (ref 0.44–1.00)
GFR, Estimated: >60 mL/min (ref >60)
Glucose, Bld: 124 mg/dL — ABNORMAL HIGH (ref 70–99)
Potassium: 3.4 mmol/L — ABNORMAL LOW (ref 3.5–5.1)
Sodium: 134 mmol/L — ABNORMAL LOW (ref 135–145)
Total Bilirubin: 1 mg/dL (ref 0.3–1.2)
Total Protein: 5.8 g/dL — ABNORMAL LOW (ref 6.5–8.1)

## 2022-03-13 LAB — CULTURE, BETA STREP (GROUP B ONLY)

## 2022-03-13 LAB — CBC
HCT: 35.6 % — ABNORMAL LOW (ref 36.0–46.0)
HCT: 36.7 % (ref 36.0–46.0)
Hemoglobin: 11.4 g/dL — ABNORMAL LOW (ref 12.0–15.0)
Hemoglobin: 11.5 g/dL — ABNORMAL LOW (ref 12.0–15.0)
MCH: 28.1 pg (ref 26.0–34.0)
MCH: 27.6 pg (ref 26.0–34.0)
MCHC: 32 g/dL (ref 30.0–36.0)
MCHC: 31.3 g/dL (ref 30.0–36.0)
MCV: 87.9 fL (ref 80.0–100.0)
MCV: 88.2 fL (ref 80.0–100.0)
Platelets: 282 10*3/uL (ref 150–400)
Platelets: 301 K/uL (ref 150–400)
RBC: 4.05 MIL/uL (ref 3.87–5.11)
RBC: 4.16 MIL/uL (ref 3.87–5.11)
RDW: 14.5 % (ref 11.5–15.5)
RDW: 14.5 % (ref 11.5–15.5)
WBC: 11.5 10*3/uL — ABNORMAL HIGH (ref 4.0–10.5)
WBC: 19.2 K/uL — ABNORMAL HIGH (ref 4.0–10.5)
nRBC: 0 % (ref 0.0–0.2)
nRBC: 0 % (ref 0.0–0.2)

## 2022-03-13 MED ORDER — SIMETHICONE 80 MG PO CHEW: 80.0000 mg | CHEWABLE_TABLET | ORAL | Status: DC | PRN

## 2022-03-13 MED ORDER — ONDANSETRON HCL 4 MG PO TABS: 4.0000 mg | ORAL_TABLET | ORAL | Status: DC | PRN

## 2022-03-13 MED ORDER — ACETAMINOPHEN 500 MG PO TABS
1000.0000 mg | ORAL_TABLET | Freq: Once | ORAL | Status: AC
  Administered 2022-03-13: 1000 mg via ORAL
  Filled 2022-03-13: qty 2

## 2022-03-13 MED ORDER — MEASLES, MUMPS & RUBELLA VAC IJ SOLR: 0.5000 mL | Freq: Once | INTRAMUSCULAR | Status: DC

## 2022-03-13 MED ORDER — ONDANSETRON HCL 4 MG/2ML IJ SOLN: 4.0000 mg | INTRAMUSCULAR | Status: DC | PRN

## 2022-03-13 MED ORDER — IBUPROFEN 600 MG PO TABS
600.0000 mg | ORAL_TABLET | Freq: Four times a day (QID) | ORAL | Status: DC
  Administered 2022-03-13 – 2022-03-15 (×7): 600 mg via ORAL
  Filled 2022-03-13 (×7): qty 1

## 2022-03-13 MED ORDER — TRANEXAMIC ACID-NACL 1000-0.7 MG/100ML-% IV SOLN
1000.0000 mg | Freq: Once | INTRAVENOUS | Status: DC
Start: 2022-03-13 — End: 2022-03-13

## 2022-03-13 MED ORDER — DIPHENHYDRAMINE HCL 25 MG PO CAPS
25.0000 mg | ORAL_CAPSULE | Freq: Four times a day (QID) | ORAL | Status: DC | PRN
Start: 2022-03-13 — End: 2022-03-15

## 2022-03-13 MED ORDER — SODIUM CHLORIDE 0.9 % IV SOLN: 250.0000 mL | INTRAVENOUS | Status: DC | PRN

## 2022-03-13 MED ORDER — TRANEXAMIC ACID-NACL 1000-0.7 MG/100ML-% IV SOLN
INTRAVENOUS | Status: AC
  Administered 2022-03-13: 1000 mg
  Filled 2022-03-13: qty 100

## 2022-03-13 MED ORDER — BUPIVACAINE HCL (PF) 0.25 % IJ SOLN
INTRAMUSCULAR | Status: DC | PRN
Start: 2022-03-13 — End: 2022-03-13
  Administered 2022-03-13 (×2): 8 mL via EPIDURAL

## 2022-03-13 MED ORDER — FENTANYL CITRATE (PF) 100 MCG/2ML IJ SOLN
INTRAMUSCULAR | Status: DC | PRN
  Administered 2022-03-13 (×3): 100 ug via EPIDURAL

## 2022-03-13 MED ORDER — TETANUS-DIPHTH-ACELL PERTUSSIS 5-2.5-18.5 LF-MCG/0.5 IM SUSY: 0.5000 mL | PREFILLED_SYRINGE | Freq: Once | INTRAMUSCULAR | Status: DC

## 2022-03-13 MED ORDER — ZOLPIDEM TARTRATE 5 MG PO TABS: 5.0000 mg | ORAL_TABLET | Freq: Every evening | ORAL | Status: DC | PRN

## 2022-03-13 MED ORDER — SODIUM CHLORIDE 0.9 % IV SOLN
2.0000 g | Freq: Four times a day (QID) | INTRAVENOUS | Status: AC
  Filled 2022-03-13: qty 2000

## 2022-03-13 MED ORDER — SODIUM CHLORIDE 0.9% FLUSH: 3.0000 mL | INTRAVENOUS | Status: DC | PRN

## 2022-03-13 MED ORDER — SODIUM CHLORIDE 0.9% FLUSH
3.0000 mL | Freq: Two times a day (BID) | INTRAVENOUS | Status: DC
  Administered 2022-03-15: 3 mL via INTRAVENOUS

## 2022-03-13 MED ORDER — GENTAMICIN SULFATE 40 MG/ML IJ SOLN
5.0000 mg/kg | INTRAVENOUS | Status: DC
  Administered 2022-03-13: 430 mg via INTRAVENOUS
  Filled 2022-03-13: qty 10.75

## 2022-03-13 MED ORDER — ACETAMINOPHEN 325 MG PO TABS
650.0000 mg | ORAL_TABLET | ORAL | Status: DC | PRN
  Filled 2022-03-13: qty 2

## 2022-03-13 MED ORDER — DIBUCAINE (PERIANAL) 1 % EX OINT: 1.0000 "application " | TOPICAL_OINTMENT | CUTANEOUS | Status: DC | PRN

## 2022-03-13 MED ORDER — SODIUM CHLORIDE 0.9 % IV SOLN
2.0000 g | Freq: Four times a day (QID) | INTRAVENOUS | Status: DC
  Administered 2022-03-13 (×2): 2 g via INTRAVENOUS
  Filled 2022-03-13: qty 2000

## 2022-03-13 MED ORDER — SENNOSIDES-DOCUSATE SODIUM 8.6-50 MG PO TABS
2.0000 | ORAL_TABLET | ORAL | Status: DC
  Administered 2022-03-14 – 2022-03-15 (×2): 2 via ORAL
  Filled 2022-03-13 (×2): qty 2

## 2022-03-13 MED ORDER — COCONUT OIL OIL
1.0000 | TOPICAL_OIL | Status: DC | PRN
Start: 2022-03-13 — End: 2022-03-15

## 2022-03-13 MED ORDER — PRENATAL MULTIVITAMIN CH
1.0000 | ORAL_TABLET | Freq: Every day | ORAL | Status: DC
  Administered 2022-03-14 – 2022-03-15 (×2): 1 via ORAL
  Filled 2022-03-13 (×2): qty 1

## 2022-03-13 MED ORDER — WITCH HAZEL-GLYCERIN EX PADS: 1.0000 "application " | MEDICATED_PAD | CUTANEOUS | Status: DC | PRN

## 2022-03-13 MED ORDER — BENZOCAINE-MENTHOL 20-0.5 % EX AERO
1.0000 "application " | INHALATION_SPRAY | CUTANEOUS | Status: DC | PRN
  Administered 2022-03-13: 1 via TOPICAL
  Filled 2022-03-13: qty 56

## 2022-03-13 NOTE — Lactation Note (Signed)
This note was copied from a baby's chart. ?Lactation Consultation Note ? ?Patient Name: Alison Archer ?Today's Date: 03/13/2022 ?Reason for consult: L&D Initial assessment;1st time breastfeeding;Term ?Age:30 hours ? ?LC in to assist with first feeding after delivery.  Baby 56 mins old and STS on Mom's chest.  Baby alert and opening her mouth.  Mom drinking from a straw.   ? ?LC offered to assist with latching, but Mom declined.  Reviewed the importance of baby latching to the breast in the first 1-2 hrs of life when baby is more awake and ready.  Mom declined. ? ?Mom to ask for assistance when on MBU. ? ?Maternal Data ?Has patient been taught Hand Expression?: No ?Does the patient have breastfeeding experience prior to this delivery?: No ? ?Feeding ?Mother's Current Feeding Choice: Breast Milk and Formula ? ?Interventions ?Interventions: Skin to skin ? ?Consult Status ?Consult Status: Follow-up from L&D ?Date: 03/13/22 ?Follow-up type: In-patient ? ? ? ?Tilda Burrow E ?03/13/2022, 5:34 PM ? ? ? ?

## 2022-03-13 NOTE — Progress Notes (Signed)
Labor Progress Note  ? ?Fortunately patient now about 9cm per RN check. However FHT with new onset fetal tachycardia (did not improve with IV fluid bolus) and maternal temp 101F. Plan to given tylenol '1000mg'$  and start amp/gent for presumed triple I. FHT still reassuring otherwise with moderate variability and 15x15 accelerations.  ? ?Cont pit as is and hopeful to she will make it to complete with fetal descent.  ? ?Patriciaann Clan, DO  ?

## 2022-03-13 NOTE — Progress Notes (Signed)
Doses not wish to see lactation at this time. ?

## 2022-03-13 NOTE — Progress Notes (Signed)
S/p 2 hour pitocin break ? ?FHT ?Baseline: 150, moderate variability, +accels, no decels ? ?Contractions: every 2 min, MVU ~120 (not on pitocin) ? ?Will restart pitocin now. Will plan to recheck 4 hours after adequate contractions or sooner if warranted for fetal status or patient clinical status. ? ?Renard Matter, MD, MPH ?OB Fellow, Faculty Practice ? ?

## 2022-03-13 NOTE — Progress Notes (Signed)
On admission RN discussed MOB's feeding plan.  MOB stated she wanted to formula here in the hospital and would try breast at home because "I know there is nothing in there right now".  RN educated MOB that right now she does have colostrum which is very good for baby.  RN also educated that if she wanted to do some breast at home it was very important to stimulate her milk supply here in the hospital.  RN explained that breast milk is made in supply and demand, therefore, if she does not do something to stimulate her milk supply in the hospital, she will not have enough milk to feed baby at home.  RN educated that she could stimulate her supply by latching infant and then supplementing with formula if she felt baby needed it, or she could start pumping when baby would be eating to stimulate her supply. MOB voiced understanding and wanted to think about her options before making a decision.  At this time MOB wanted to give formula and she will make a decision about breastfeeding later.   ? ?MOB and FOB were educated on bottle feeding and volume guidelines.   ? ?Lactation and the night shift RN were updated on this education. ? ?Jeani Fassnacht, [place] ? ?

## 2022-03-13 NOTE — Discharge Summary (Signed)
? ?  Postpartum Discharge Summary ? ?Date of Service updated ? ?   ?Patient Name: Alison Archer ?DOB: January 21, 1991 ?MRN: [id number] ? ?Date of admission: 03/11/2022 ?Delivery date:03/13/2022  ?Delivering provider: Patriciaann Clan  ?Date of discharge: 03/15/2022 ? ?Admitting diagnosis: Gestational hypertension [O13.9] ?Intrauterine pregnancy: [redacted]w[redacted]d     ?Secondary diagnosis:  Active Problems: ?  Supervision of other normal pregnancy, antepartum ?  Alpha thalassemia silent carrier ?  Obesity in pregnancy ?  Pre-eclampsia, mild ?  Chorioamnionitis ?  VBAC (vaginal birth after Cesarean) ? ?Additional problems:     ?Discharge diagnosis: Term Pregnancy Delivered and Preeclampsia (mild)                                              ?Post partum procedures: none ?Augmentation: AROM, Pitocin, and IP Foley ?Complications: Intrauterine Inflammation or infection (Chorioamniotis) ? ?Hospital course: Induction of Labor With Vaginal Delivery   ?31 y.o. yo G2P1001 at [redacted]w[redacted]d was admitted to the hospital 03/11/2022 for induction of labor.  Indication for induction: Preeclampsia.  Patient had an uncomplicated labor course as follows: ?Membrane Rupture Time/Date: 4:45 PM ,03/12/2022   ?Delivery Method:Vaginal, Spontaneous  ?Episiotomy: None  ?Lacerations:  None  ?Details of delivery can be found in separate delivery note.  Patient had a routine postpartum course. Patient is discharged home 03/15/22. She denies HA, vision changes, RUQ pain, chest pain/dyspnea, and was discharged on lasix $Remove'20mg'osZUUCd$  daily x 5 days total and Procardia $RemoveBefor'60mg'tzjMBjeRABoa$  daily. Discussed pre-E signs/symptoms to watch for. ? ?Newborn Data: ?Birth date:03/13/2022  ?Birth time:4:58 PM  ?Gender:Female  ?Living status:Living  ?Apgars:7 ,8  ?Weight:3480 g  ? ?Magnesium Sulfate received: No ?BMZ received: No ?Rhophylac:No ?MMR:No ?T-DaP:Given prenatally ?Flu: No ?Transfusion:No ? ?Physical exam  ?Vitals:  ? 03/14/22 2310 03/15/22 0230 03/15/22 0510 03/15/22 1045  ?BP: (!) 145/94 139/82  136/82 135/84  ?Pulse: 93 90  84  ?Resp: $Remov'18  18 16  'uXidao$ ?Temp: 98.7 ?F (37.1 ?C)  98.5 ?F (36.9 ?C)   ?TempSrc: Oral  Oral   ?SpO2: 100% 99% 99%   ?Weight:      ?Height:      ? ?General: alert, cooperative, and no distress ?Lochia: appropriate ?Uterine Fundus: firm ?Incision: N/A ?DVT Evaluation: No evidence of DVT seen on physical exam. ?No cords or calf tenderness. ?Calf/Ankle edema is present symmetric bilaterally, homans negative ?Labs: ?Lab Results  ?Component Value Date  ? WBC 19.6 (H) 03/14/2022  ? HGB 10.1 (L) 03/14/2022  ? HCT 31.9 (L) 03/14/2022  ? MCV 89.4 03/14/2022  ? PLT 278 03/14/2022  ? ? ?  Latest Ref Rng & Units 03/13/2022  ?  4:20 AM  ?CMP  ?Glucose 70 - 99 mg/dL 124    ?BUN 6 - 20 mg/dL <5    ?Creatinine 0.44 - 1.00 mg/dL 0.68    ?Sodium 135 - 145 mmol/L 134    ?Potassium 3.5 - 5.1 mmol/L 3.4    ?Chloride 98 - 111 mmol/L 104    ?CO2 22 - 32 mmol/L 21    ?Calcium 8.9 - 10.3 mg/dL 8.6    ?Total Protein 6.5 - 8.1 g/dL 5.8    ?Total Bilirubin 0.3 - 1.2 mg/dL 1.0    ?Alkaline Phos 38 - 126 U/L 129    ?AST 15 - 41 U/L 16    ?ALT 0 - 44 U/L 13    ? ?  Edinburgh Score: ? ?  03/14/2022  ? 11:00 AM  ?Flavia Shipper Postnatal Depression Scale Screening Tool  ?I have been [place] to laugh and see the funny side of things. 0  ?I have looked forward with enjoyment to things. 0  ?I have blamed myself unnecessarily when things went wrong. 0  ?I have been anxious or worried for no good reason. 0  ?I have felt scared or panicky for no good reason. 0  ?Things have been getting on top of me. 0  ?I have been so unhappy that I have had difficulty sleeping. 0  ?I have felt sad or miserable. 0  ?I have been so unhappy that I have been crying. 0  ?The thought of harming myself has occurred to me. 0  ?Edinburgh Postnatal Depression Scale Total 0  ? ? ? ?After visit meds:  ?Allergies as of 03/15/2022   ?No Known Allergies ?  ? ?  ?Medication List  ?  ? ?TAKE these medications   ? ?Blood Pressure Monitor Automat Devi ?1 Device by Does not  apply route daily. Automatic blood pressure cuff regular size. To monitor blood pressure regularly at home. ICD-10 code: O09.90 ?  ?furosemide 20 MG tablet ?Commonly known as: LASIX ?Take 1 tablet (20 mg total) by mouth daily for 4 days. ?  ?Gojji Weight Scale Misc ?1 Device by Does not apply route daily as needed. To weight self daily as needed at home. ICD-10 code: Z34.90 ?  ?ibuprofen 600 MG tablet ?Commonly known as: ADVIL ?Take 1 tablet (600 mg total) by mouth every 6 (six) hours. ?  ?NIFEdipine 60 MG 24 hr tablet ?Commonly known as: ADALAT CC ?Take 1 tablet (60 mg total) by mouth daily. ?Start taking on: March 16, 2022 ?  ?prenatal vitamin w/FE, FA 27-1 MG Tabs tablet ?Take 1 tablet by mouth daily at 12 noon. ?  ? ?  ? ? ? ?Discharge home in stable condition ?Infant Feeding:  Both ?Infant Disposition:home with mother ?Discharge instruction: per After Visit Summary and Postpartum booklet. ?Activity: Advance as tolerated. Pelvic rest for 6 weeks.  ?Diet: routine diet ?Future Appointments: ?Future Appointments  ?Date Time Provider [place]  ?03/23/2022 10:00 AM [facility] NURSE [facility]  ?04/15/2022  2:35 PM Patriciaann Clan, DO [facility]  ? ?Follow up Visit: ? Follow-up Information   ? ? [facility] Follow up.   ?Specialty: Obstetrics and Gynecology ?Contact information: ?[address] ?[place] [address] ?[phone] ? ?  ?  ? ?  ?  ? ?  ? ? ?Message sent to [facility] by Dr Higinio Plan- ?Please schedule this patient for a In person postpartum visit in 6 weeks with the following provider: Any provider. ?Additional Postpartum F/U:BP check 1 week  ?High risk pregnancy complicated by: HTN, previous CS  ?Delivery mode:  Vaginal, Spontaneous  ?Anticipated Birth Control:  Planning postpartum BTL  ? ? ?03/15/2022 ?Lenoria Chime, MD ? ? ? ?

## 2022-03-13 NOTE — Progress Notes (Signed)
Labor Progress Note  ? ?Now complete but still around -1 station. Opted to try a few pushes first. Pushed with about 10 contractions. She was [place] to push to about 0 station but returns to -1 afterwards. Bedside US with what appears to be OA, but difficult to say (as is remaining caput on fetal head), body is right sided. Placed onto her left lateral with a peanut and hopeful for continued passive descent.  ? ?Plan to recheck in an hour or so (sooner if needed). FHT still reassuring (+accels/mod var) but cat II for tachycardia that now is more around 160-170's.  ? ?Alison Clan, DO  ?

## 2022-03-14 ENCOUNTER — Encounter (HOSPITAL_COMMUNITY): Payer: Self-pay | Admitting: Anesthesiology

## 2022-03-14 ENCOUNTER — Encounter (HOSPITAL_COMMUNITY): Payer: Self-pay | Admitting: Obstetrics and Gynecology

## 2022-03-14 ENCOUNTER — Encounter (HOSPITAL_COMMUNITY)
Admission: AD | Disposition: A | Discharge status: Home / Self Care | Payer: Self-pay | Source: Home / Self Care | Attending: Obstetrics and Gynecology

## 2022-03-14 LAB — CBC
HCT: 31.9 % — ABNORMAL LOW (ref 36.0–46.0)
Hemoglobin: 10.1 g/dL — ABNORMAL LOW (ref 12.0–15.0)
MCH: 28.3 pg (ref 26.0–34.0)
MCHC: 31.7 g/dL (ref 30.0–36.0)
MCV: 89.4 fL (ref 80.0–100.0)
Platelets: 278 10*3/uL (ref 150–400)
RBC: 3.57 MIL/uL — ABNORMAL LOW (ref 3.87–5.11)
RDW: 14.5 % (ref 11.5–15.5)
WBC: 19.6 10*3/uL — ABNORMAL HIGH (ref 4.0–10.5)
nRBC: 0 % (ref 0.0–0.2)

## 2022-03-14 SURGERY — LIGATION, FALLOPIAN TUBE, POSTPARTUM
Anesthesia: Choice

## 2022-03-14 MED ORDER — LACTATED RINGERS IV SOLN: INTRAVENOUS | Status: DC

## 2022-03-14 MED ORDER — NIFEDIPINE ER OSMOTIC RELEASE 30 MG PO TB24
30.0000 mg | ORAL_TABLET | Freq: Every day | ORAL | Status: DC
Start: 2022-03-14 — End: 2022-03-15
  Administered 2022-03-14 – 2022-03-15 (×2): 30 mg via ORAL
  Filled 2022-03-14 (×2): qty 1

## 2022-03-14 MED ORDER — FUROSEMIDE 20 MG PO TABS
20.0000 mg | ORAL_TABLET | Freq: Every day | ORAL | Status: DC
  Administered 2022-03-14 – 2022-03-15 (×2): 20 mg via ORAL
  Filled 2022-03-14 (×2): qty 1

## 2022-03-14 MED ORDER — TETANUS-DIPHTH-ACELL PERTUSSIS 5-2.5-18.5 LF-MCG/0.5 IM SUSY
0.5000 mL | PREFILLED_SYRINGE | Freq: Once | INTRAMUSCULAR | Status: DC
Start: 2022-03-15 — End: 2022-03-15

## 2022-03-14 MED ORDER — NIFEDIPINE ER OSMOTIC RELEASE 30 MG PO TB24: 30.0000 mg | ORAL_TABLET | Freq: Every day | ORAL | Status: DC

## 2022-03-14 MED ORDER — FAMOTIDINE 20 MG PO TABS: 40.0000 mg | ORAL_TABLET | Freq: Once | ORAL | Status: DC

## 2022-03-14 MED ORDER — METOCLOPRAMIDE HCL 10 MG PO TABS: 10.0000 mg | ORAL_TABLET | Freq: Once | ORAL | Status: DC

## 2022-03-14 MED ORDER — MEASLES, MUMPS & RUBELLA VAC IJ SOLR: 0.5000 mL | Freq: Once | INTRAMUSCULAR | Status: DC

## 2022-03-14 NOTE — Progress Notes (Signed)
? ? ?  Faculty Practice OB/GYN Attending Postpartum Sterilization Counseling Note ? ?31 y.o. [id number] s/p recent vaginal delivery at 8w5dwho desires permanent sterilization. Medicaid papers had been signed on 12/17/2021.  Other reversible forms of contraception including the most effective LARCs such as IUD or Nexplanon were discussed with patient; she declines all other modalities. Her FOB also declined vasectomy, after being counseled about this procedure being less invasive and more effective.  Patient was also given the option of an interval laparoscopic tubal ligation or bilateral salpingectomy which slightly increases the efficacy and is less invasive and she is actually interested in this modality.   ? ?Patient desires interval laparoscopic bilateral tubal sterilization.  Discussed options of laparoscopic bilateral tubal sterilization using Filshie clips vs laparoscopic bilateral salpingectomy. Risks and benefits discussed in detail including but not limited to: risk of regret, permanence of method, bleeding, infection, injury to surrounding organs and need for additional procedures.  Failure risk of about 1% for Filshie clips and <1% for bilateral salpingectomy with increased risk of ectopic gestation if pregnancy occurs was also discussed with patient.  Also discussed possibility of post-tubal syndrome with increased pelvic pain or menstrual irregularities.  Patient verbalized understanding of these risks and benefits and wants to proceed with sterilization with laparoscopic bilateral sterilization using Filshie clips. All questions were answered.  She was told that she will be contacted by our surgical scheduler regarding the time and date of her surgery which will be over 6 weeks from delivery; routine preoperative instructions will be given to her by the preoperative nursing team.  In the meantime, patient will use recommended abstinence for contraception prior to surgery. ? ?Will continue routine  postpartum care.   ? ? ?UVerita Schneiders MD, FACOG ?Obstetrician &Social research officer, government Faculty Practice ?[facility] [place]?

## 2022-03-14 NOTE — Anesthesia Postprocedure Evaluation (Signed)
Anesthesia Post Note ? ?Patient: Alison Archer ? ?Procedure(s) Performed: AN AD HOC LABOR EPIDURAL ? ?  ? ?Patient location during evaluation: Mother Baby ?Anesthesia Type: Epidural ?Level of consciousness: awake and alert ?Pain management: pain level controlled ?Vital Signs Assessment: post-procedure vital signs reviewed and stable ?Respiratory status: spontaneous breathing, nonlabored ventilation and respiratory function stable ?Cardiovascular status: stable ?Postop Assessment: no headache, no backache, epidural receding, no apparent nausea or vomiting, patient [name] to bend at knees, adequate PO intake and [name] to ambulate ?Anesthetic complications: no ? ? ?No notable events documented. ? ?Last Vitals:  ?Vitals:  ? 03/14/22 0314 03/14/22 0646  ?BP: 129/87 (!) 141/86  ?Pulse: 89 79  ?Resp: 18 18  ?Temp: 36.6 ?C 36.6 ?C  ?SpO2:    ?  ?Last Pain:  ?Vitals:  ? 03/14/22 0817  ?TempSrc:   ?PainSc: 3   ? ?Pain Goal:   ? ?  ?  ?  ?  ?  ?  ?  ? ?Denym Christenberry Hristova ? ? ? ? ?

## 2022-03-14 NOTE — Plan of Care (Signed)
?  Problem: Clinical Measurements: Goal: Ability to maintain clinical measurements within normal limits will improve Outcome: Completed/Met Goal: Will remain free from infection Outcome: Completed/Met Goal: Diagnostic test results will improve Outcome: Completed/Met Goal: Respiratory complications will improve Outcome: Completed/Met Goal: Cardiovascular complication will be avoided Outcome: Completed/Met   Problem: Activity: Goal: Risk for activity intolerance will decrease Outcome: Completed/Met   Problem: Elimination: Goal: Will not experience complications related to bowel motility Outcome: Completed/Met   Problem: Pain Managment: Goal: General experience of comfort will improve Outcome: Completed/Met   Problem: Safety: Goal: Ability to remain free from injury will improve Outcome: Completed/Met   Problem: Skin Integrity: Goal: Risk for impaired skin integrity will decrease Outcome: Completed/Met   

## 2022-03-14 NOTE — Progress Notes (Signed)
RN called Dr Higinio Plan regarding patient's blood pressures (see vital signs.) . Parameters were given to call doctor if bp is = to or greater than 160/110. Dr will review in am.  ?

## 2022-03-14 NOTE — Anesthesia Preprocedure Evaluation (Signed)
Anesthesia Evaluation  ? ? ?Reviewed: ?Allergy & Precautions, Patient's Chart, lab work & pertinent test results ? ?History of Anesthesia Complications ?Negative for: history of anesthetic complications ? ?Airway ? ? ? ? ? ? ? Dental ?  ?Pulmonary ?asthma , former smoker,  ?  ? ? ? ? ? ? ? Cardiovascular ?negative cardio ROS ? ? ? ? ?  ?Neuro/Psych ?negative neurological ROS ? negative psych ROS  ? GI/Hepatic ?negative GI ROS, Neg liver ROS,   ?Endo/Other  ?negative endocrine ROS ? Renal/GU ?negative Renal ROS  ?negative genitourinary ?  ?Musculoskeletal ?negative musculoskeletal ROS ?(+)  ? Abdominal ?  ?Peds ? Hematology ?negative hematology ROS ?(+)   ?Anesthesia Other Findings ?Day of surgery medications reviewed with patient. ? Reproductive/Obstetrics ?PPD#1 ? ?  ? ? ? ? ? ? ? ? ? ? ? ? ? ?  ?  ? ? ? ? ? ? ? ? ?Anesthesia Physical ?Anesthesia Plan ? ?ASA: 3 ? ?Anesthesia Plan:   ? ?Post-op Pain Management:   ? ?Induction:  ? ?PONV Risk Score and Plan: Treatment may vary due to age or medical condition ? ?Airway Management Planned:  ? ?Additional Equipment: None ? ?Intra-op Plan:  ? ?Post-operative Plan:  ? ?Informed Consent:  ? ?Plan Discussed with:  ? ?Anesthesia Plan Comments:   ? ? ? ? ? ? ?Anesthesia Quick Evaluation ? ?

## 2022-03-14 NOTE — Progress Notes (Signed)
POSTPARTUM PROGRESS NOTE ? ?Subjective: Alison Archer is a 31 y.o. [id number] PPD#1 s/p VBAC/SVD at [redacted]w[redacted]d  She reports she doing well. No acute events overnight. She denies any problems with ambulating, voiding or po intake. Denies nausea or vomiting. She has  passed flatus. Pain is well controlled.  Lochia is scant. ? ?Objective: ?Blood pressure (!) 141/86, pulse 79, temperature 97.8 ?F (36.6 ?C), temperature source Oral, resp. rate 18, height 5' 5.5" (1.664 m), weight 125.3 kg, last menstrual period 06/08/2021, SpO2 100 %, unknown if currently breastfeeding. ? ?Physical Exam:  ?General: alert, cooperative and no distress ?Chest: no respiratory distress ?Abdomen: soft, non-tender  ?Uterine Fundus: firm, appropriately tender ?Extremities: No calf swelling or tenderness  no edema ? ?Recent Labs  ?  03/13/22 ?1846 03/14/22 ?04401 ?HGB 11.5* 10.1*  ?HCT 36.7 31.9*  ? ? ?Assessment/Plan: ?Alison VENEZIAis a 31y.o. G2P2002 ppd#1 s/p VBAC/SVD at 333w5d ?Routine Postpartum Care: Doing well, pain well-controlled. Would like to stay till tomorrow ?-- Continue routine care, lactation support  ?-- Contraception: plans BTL. Scheduled for today. Discussed with patient that surgeon doing procedure is Dr. AnHarolyn Rutherfordnd should be coming by to meet her shortly. They will further discuss procedure and given her BMI ultimately discuss if candidate for PP BTL. ?-- Feeding: both ? ?Dispo: Plan for discharge PPD#2. ? ?AnRenard MatterMD, MPH ?OB Fellow, Faculty Practice ?[facility]? ?

## 2022-03-15 ENCOUNTER — Encounter: Payer: Medicaid Other | Admitting: Family Medicine

## 2022-03-15 ENCOUNTER — Other Ambulatory Visit (HOSPITAL_COMMUNITY): Payer: Self-pay

## 2022-03-15 MED ORDER — IBUPROFEN 600 MG PO TABS
600.0000 mg | ORAL_TABLET | Freq: Four times a day (QID) | ORAL | 0 refills | Status: DC
  Filled 2022-03-15: qty 30, 8d supply, fill #0

## 2022-03-15 MED ORDER — NIFEDIPINE ER 60 MG PO TB24
60.0000 mg | ORAL_TABLET | Freq: Every day | ORAL | 0 refills | Status: DC
  Filled 2022-03-15: qty 30, 30d supply, fill #0

## 2022-03-15 MED ORDER — NIFEDIPINE ER OSMOTIC RELEASE 30 MG PO TB24: 60.0000 mg | ORAL_TABLET | Freq: Every day | ORAL | Status: DC

## 2022-03-15 MED ORDER — FUROSEMIDE 20 MG PO TABS
20.0000 mg | ORAL_TABLET | Freq: Every day | ORAL | 0 refills | Status: DC
  Filled 2022-03-15: qty 4, 4d supply, fill #0

## 2022-03-15 MED ORDER — NIFEDIPINE ER OSMOTIC RELEASE 30 MG PO TB24
30.0000 mg | ORAL_TABLET | Freq: Every day | ORAL | Status: AC
  Administered 2022-03-15: 30 mg via ORAL
  Filled 2022-03-15: qty 1

## 2022-03-15 NOTE — Anesthesia Postprocedure Evaluation (Signed)
Anesthesia Post Note ? ?Patient: Alison Archer ? ?Procedure(s) Performed: POST PARTUM TUBAL LIGATION (canceled) ? ?  ? ?Patient location during evaluation: Mother Baby ?Anesthesia Type: Epidural ?Level of consciousness: awake, oriented and awake and alert ?Respiratory status: spontaneous breathing, respiratory function stable and nonlabored ventilation ?Cardiovascular status: stable ?Postop Assessment: adequate PO intake, Brinkley to ambulate, patient Winning to bend at knees and no apparent nausea or vomiting ?Anesthetic complications: no ? ? ?No notable events documented. ? ?Last Vitals:  ?Vitals:  ? 03/15/22 1045 03/15/22 1200  ?BP: 135/84 132/74  ?Pulse: 84 86  ?Resp: 16 16  ?Temp:    ?SpO2:    ?  ?Last Pain:  ?Vitals:  ? 03/15/22 1440  ?TempSrc:   ?PainSc: 0-No pain  ? ?Pain Goal:   ? ?  ?  ?  ?  ?  ?  ?  ? ?Kollins Fenter ? ? ? ? ?

## 2022-03-16 LAB — SURGICAL PATHOLOGY

## 2022-03-18 IMAGING — US US MFM OB FOLLOW-UP
1 series · 13 of 28 positions shown · non-contrast
Comparison: none

[Series 1: us mfm ob follow-up · 13 of 94 slices shown]
[im 4/94]
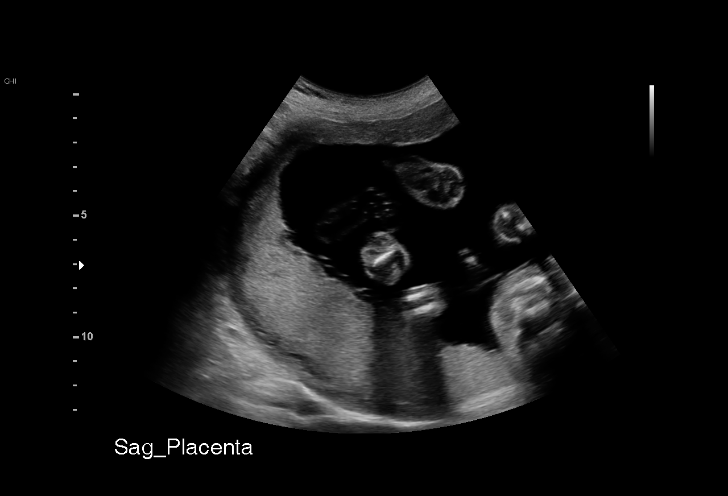
[im 11/94]
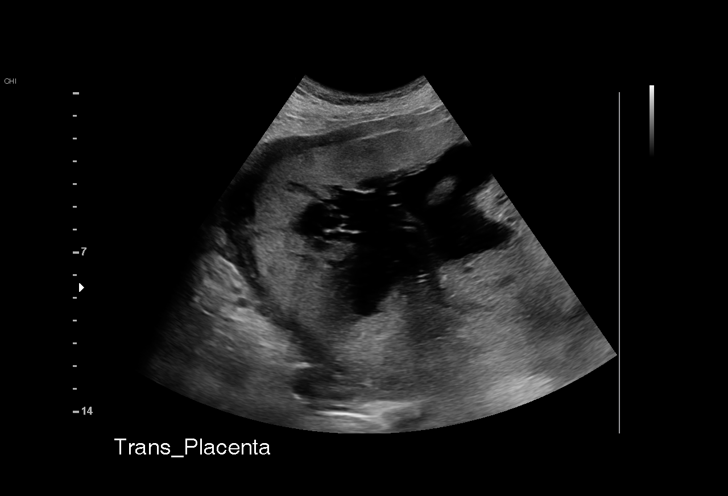
[im 18/94]
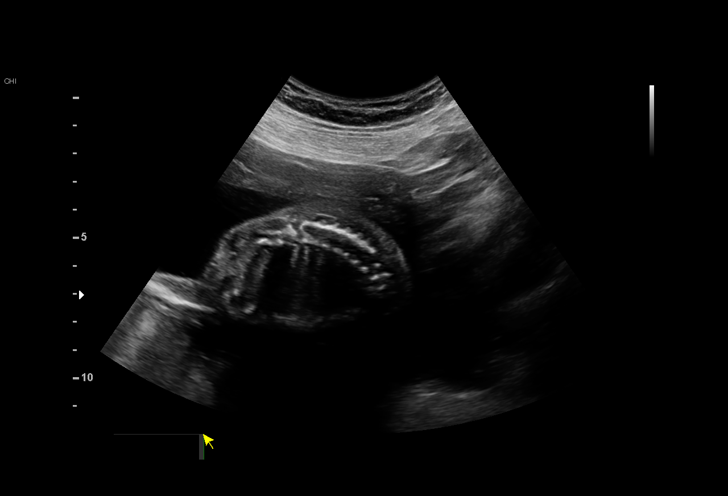
[im 25/94]
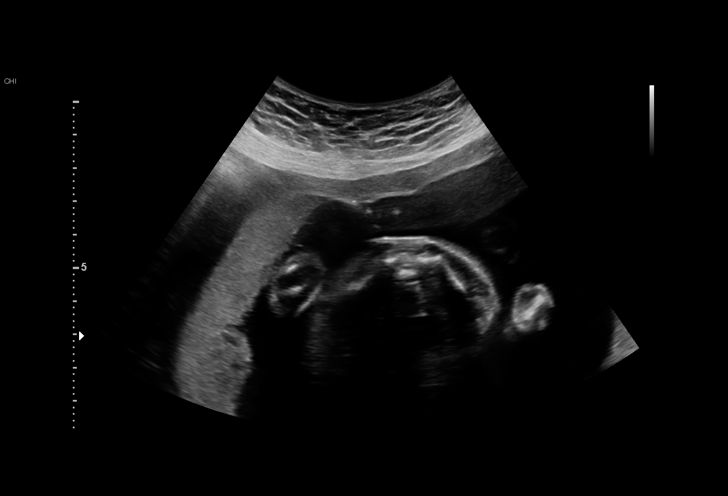
[im 32/94]
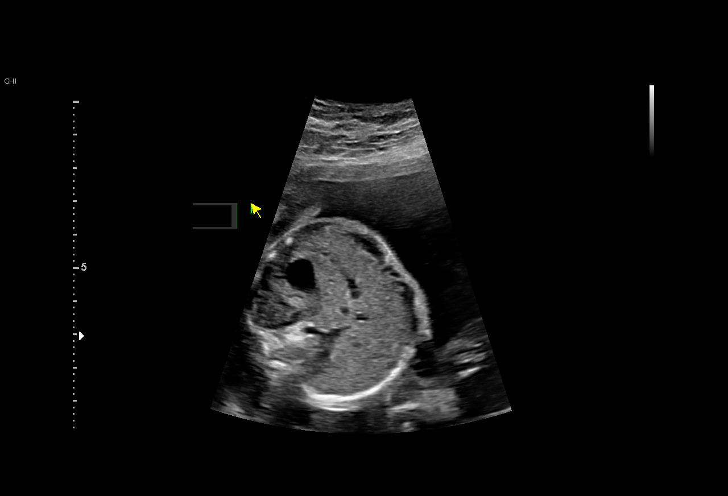
[im 38/94]
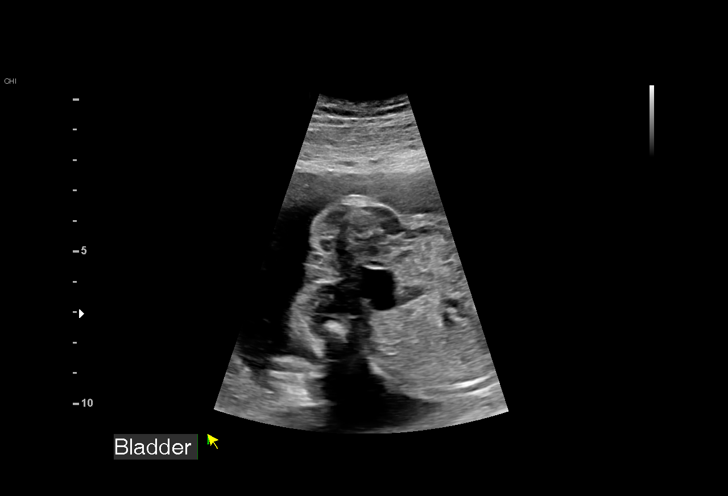
[im 49/94]
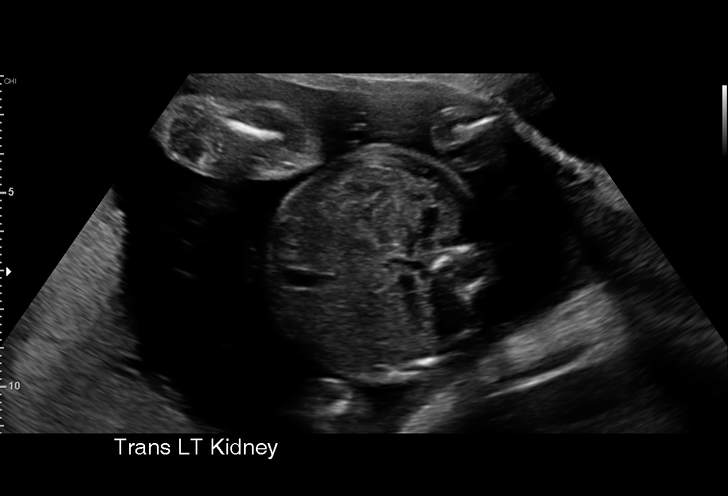
[im 56/94]
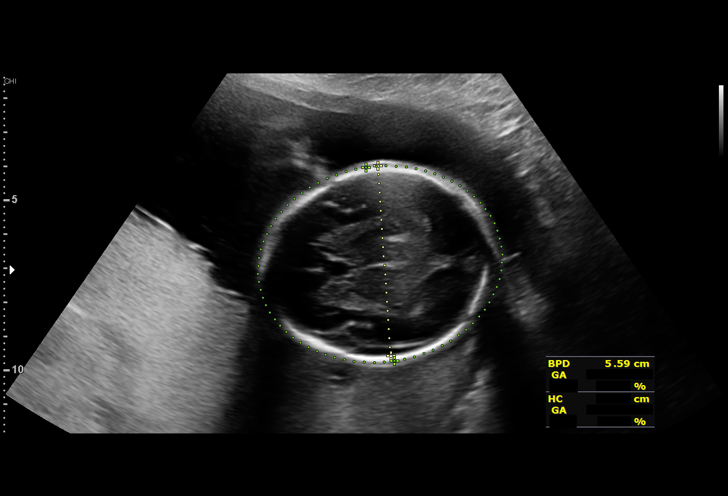
[im 63/94]
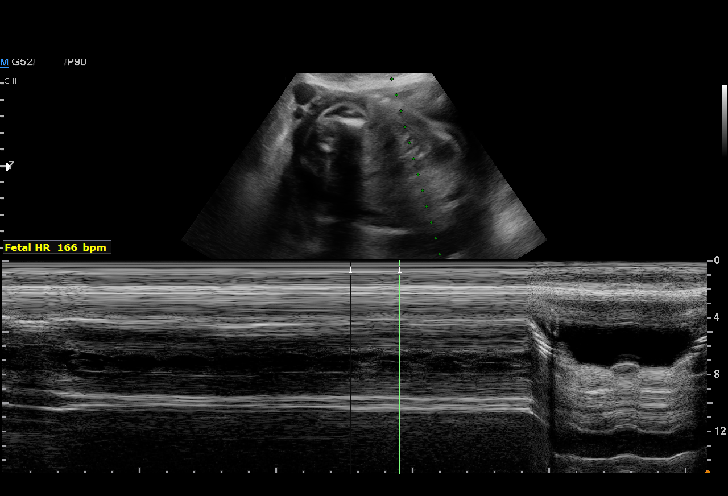
[im 69/94]
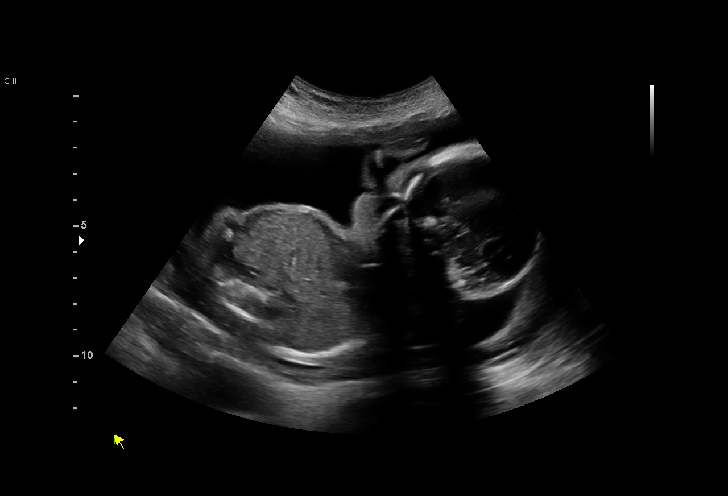
[im 76/94]
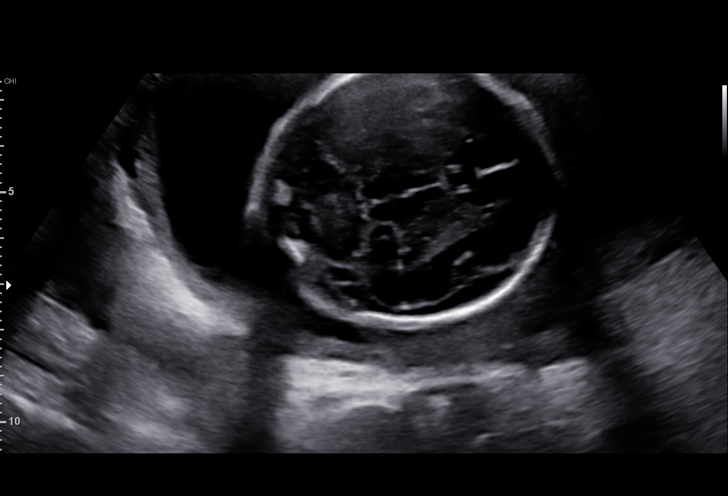
[im 83/94]
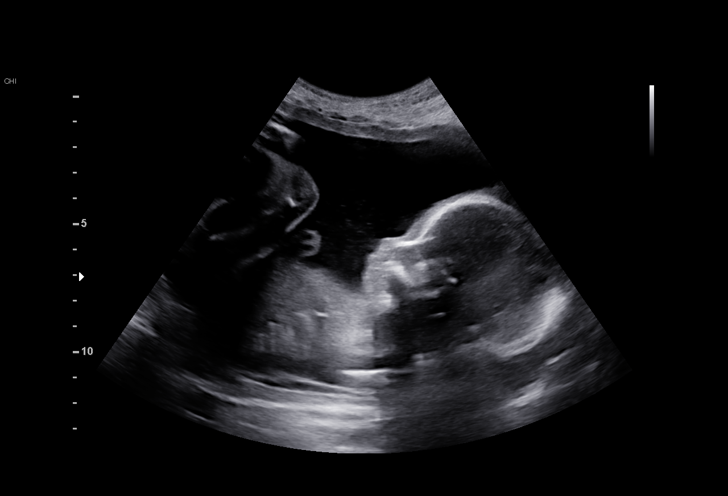
[im 90/94]
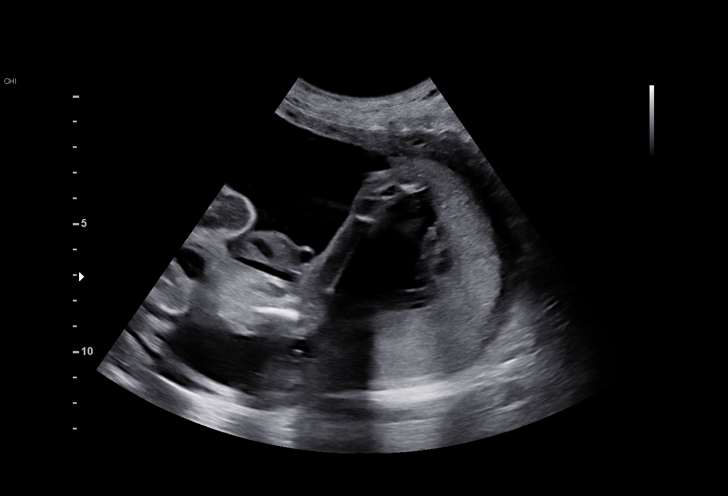

[13 of 28 positions shown; findings below may reference images not displayed]

[REDACTED]care
                   WALKER CNM

Indications

 Obesity complicating pregnancy, second
 trimester (pre-gestational BMI 34.7)
 Antenatal follow-up for nonvisualized fetal
 anatomy
 Genetic carrier (Silent Carrier alpha
 thalassemia)
 LR NIPS
 History of cesarean delivery, currently
 pregnant
 23 weeks gestation of pregnancy
Fetal Evaluation

 Num Of Fetuses:         1
 Fetal Heart Rate(bpm):  166
 Cardiac Activity:       Observed
 Presentation:           Cephalic
 Placenta:               Posterior Fundal
 P. Cord Insertion:      Previously Visualized

 Amniotic Fluid
 AFI FV:      Within normal limits

                             Largest Pocket(cm)

Biometry
 BPD:      55.8  mm     G. Age:  23w 0d         35  %    CI:        74.25   %    70 - 86
                                                         FL/HC:      19.7   %    19.2 -
 HC:      205.6  mm     G. Age:  22w 5d         14  %    HC/AC:      1.11        1.05 -
 AC:      185.3  mm     G. Age:  23w 2d         43  %    FL/BPD:     72.6   %    71 - 87
 FL:       40.5  mm     G. Age:  23w 1d         32  %    FL/AC:      21.9   %    20 - 24
 LV:        3.3  mm

 Est. FW:     569  gm      1 lb 4 oz     36  %
OB History

 Blood Type:   O+
 Gravidity:    2         Term:   1
 Living:       1
Gestational Age

 LMP:           23w 2d        Date:  06/08/21                 EDD:   03/15/22
 U/S Today:     23w 0d                                        EDD:   03/17/22
 Best:          23w 2d     Det. By:  LMP  (06/08/21)          EDD:   03/15/22
Anatomy

 Cranium:               Appears normal         LVOT:                   Appears normal
 Cavum:                 Appears normal         Aortic Arch:            Not well visualized
 Ventricles:            Appears normal         Ductal Arch:            Previously seen
 Choroid Plexus:        Previously seen        Diaphragm:              Appears normal
 Cerebellum:            Previously seen        Stomach:                Appears normal, left
                                                                       sided
 Posterior Fossa:       Previously seen        Abdomen:                Appears normal
 Nuchal Fold:           Previously seen        Abdominal Wall:         Appears nml (cord
                                                                       insert, abd wall)
 Face:                  Orbits and profile     Cord Vessels:           Appears normal (3
                        previously seen                                vessel cord)
 Lips:                  Appears normal         Kidneys:                Left UTD = 4.3mm
 Palate:                Not well visualized    Bladder:                Appears normal
 Thoracic:              Appears normal         Spine:                  Appears normal
 Heart:                 Appears normal         Upper Extremities:      Previously seen
                        (4CH, axis, and
                        situs)
 RVOT:                  Not well visualized    Lower Extremities:      Previously seen

 Other:  Previously fetus appears to be female. VC, 3VV, 3VTV, NB and
         lenses prev. visualized. Heels and left 5th digit prev. visualized.
Cervix Uterus Adnexa

 Cervix
 Length:            4.3  cm.
 Normal appearance by transabdominal scan.
Comments

 This patient was seen for a follow up growth scan due to
 maternal obesity.  The views of the fetal anatomy were
 unable to be completed during her last exam.  She denies
 any problems since her last exam.
 She was informed that the fetal growth and amniotic fluid
 level appears appropriate for her gestational age.
 The fetal cardiac views remain suboptimal due to the fetal
 position.
 A follow up exam was scheduled in 4 weeks to try to
 complete the fetal cardiac views.

## 2022-03-23 ENCOUNTER — Telehealth (HOSPITAL_COMMUNITY): Payer: Self-pay | Admitting: *Deleted

## 2022-03-23 ENCOUNTER — Ambulatory Visit: Payer: Medicaid Other

## 2022-03-23 NOTE — Telephone Encounter (Signed)
Left phone voicemail message. ? ?Odis Hollingshead, RN 03-23-2022 at 9:03am ?

## 2022-03-25 ENCOUNTER — Ambulatory Visit: Payer: Medicaid Other

## 2022-03-29 ENCOUNTER — Ambulatory Visit: Payer: Medicaid Other

## 2022-03-30 ENCOUNTER — Telehealth: Payer: Self-pay

## 2022-03-30 ENCOUNTER — Ambulatory Visit: Payer: Medicaid Other

## 2022-03-30 NOTE — Telephone Encounter (Signed)
Called pt to follow up on missed appt. Offered virtual appt now, pt declines. Patient would like to reschedule for a virtual appt tomorrow. Has BP cuff at home. Appt scheduled for tomorrow, 03/30/22 at 1040. ?

## 2022-03-31 ENCOUNTER — Telehealth (INDEPENDENT_AMBULATORY_CARE_PROVIDER_SITE_OTHER): Payer: Medicaid Other

## 2022-03-31 VITALS — BP 138/95 | HR 83

## 2022-03-31 DIAGNOSIS — Z0130 Encounter for examination of blood pressure without abnormal findings: Secondary | ICD-10-CM

## 2022-03-31 NOTE — Progress Notes (Signed)
Blood Pressure Check Visit ? ?Alison Archer met with me virtually for a blood pressure check following a vaginal delivery on 03/13/22. Patient checked BP at home and stated it was 136/95. Patient rechecked BP approximately 5-10 minutes later and it was 138/95. Patient denies any dizziness, blurred vision, headache, shortness of breath, peripheral edema. Patient is prescribed Nifedipine 60 mg daily. When I asked the patient if she takes her Nifedipine daily she stated "I don't take it everyday because I don't need it everyday- like for instance, today my blood pressure is great so I don't need it." I provided education to patient on normal vs elevated blood pressure readings. I reviewed all of this with Dr. Dione Plover. Per Dr. Dione Plover patient should take her Nifedipine daily and stop taking it two days prior to her postpartum visit where we will check her blood pressure. I reviewed this with the patient. I also reviewed signs and symptoms of pre-eclampsia with patient and encouraged the patient to continue checking her BP at home and charting it in the babyscripts app. Patient verbalized understanding and denies any other questions.  ? ? ?Seth Bake, RN ?03/31/2022   ?

## 2022-04-15 ENCOUNTER — Telehealth: Payer: Medicaid Other | Admitting: Family Medicine

## 2022-04-15 NOTE — Progress Notes (Signed)
Joined mychart video for pp visit. Patient states she has a lot going on today and cannot really pay attention to the appt. Offered to reschedule appt and patient desires that. Told her I will let our front office know. Patient verbalized understanding.

## 2022-04-15 NOTE — Progress Notes (Deleted)
    [name] Visit Note  Alison Archer is a 31 y.o. [id number] female who presents for a postpartum visit. She is 4 weeks postpartum following a normal spontaneous vaginal delivery.  I have fully reviewed the prenatal and intrapartum course. The delivery was at 39.5 gestational weeks.  Anesthesia: epidural. Postpartum course has been ***. Baby is doing well***. Baby is feeding by bottle - Jerlyn Ly Start . Bleeding no bleeding. Bowel function is normal. Bladder function is normal. Patient is not sexually active. Contraception method is none. Patient plans BTL. Postpartum depression screening: negative.   The pregnancy intention screening data noted above was reviewed. Potential methods of contraception were discussed. The patient elected to proceed with No data recorded.    Health Maintenance Due  Topic Date Due   COVID-19 Vaccine (1) Never done   PAP-Cervical Cytology Screening  Never done   PAP SMEAR-Modifier  Never done    {Common ambulatory SmartLinks:19316}  Review of Systems {ros; complete:30496}  Objective:  LMP 06/08/2021    General:  {gen appearance:16600}   Breasts:  {desc; normal/abnormal/not indicated:14647}  Lungs: {lung exam:16931}  Heart:  {heart exam:5510}  Abdomen: {abdomen exam:16834}   Wound {Wound assessment:11097}  GU exam:  {desc; normal/abnormal/not indicated:14647}       Assessment:    There are no diagnoses linked to this encounter.  *** postpartum exam.   Plan:   Essential components of care per ACOG recommendations:  1.  Mood and well being: Patient with {gen negative/positive:315881} depression screening today. Reviewed local resources for support.  - Patient tobacco use? {tobacco use:25506}  - hx of drug use? {yes/no:25505}    2. Infant care and feeding:  -Patient currently breastmilk feeding? {yes/no:25502}  -Social determinants of health (SDOH) reviewed in EPIC. No concerns***The following needs were identified***  3. Sexuality,  contraception and birth spacing - Patient {DOES_DOES UJW:11914} want a pregnancy in the next year.  Desired family size is {NUMBER 1-10:22536} children.  - Reviewed reproductive life planning. Reviewed contraceptive methods based on pt preferences and effectiveness.  Patient desired {Upstream End Methods:24109} today.   - Discussed birth spacing of 18 months  4. Sleep and fatigue -Encouraged family/partner/community support of 4 hrs of uninterrupted sleep to help with mood and fatigue  5. Physical Recovery  - Discussed patients delivery and complications. She describes her labor as {description:25511} - Patient had a {CHL AMB [phone]}. Patient had a {laceration:25518} laceration. Perineal healing reviewed. Patient expressed understanding - Patient has urinary incontinence? {yes/no:25515} - Patient {ACTION; IS/IS NWG:[id number]} safe to resume physical and sexual activity  6.  Health Maintenance - HM due items addressed {Yes or If no, why not?:20788} - Last pap smear No results found for: DIAGPAP Pap smear {done:10129} at today's visit.  -Breast Cancer screening indicated? {indicated:25516}  7. Chronic Disease/Pregnancy Condition follow up: {Follow up:25499}  - PCP follow up  Derinda Late, [place] for [employer], [place]

## 2022-04-16 NOTE — Progress Notes (Signed)
Patient rescheduled visit.

## 2022-05-11 ENCOUNTER — Encounter (HOSPITAL_BASED_OUTPATIENT_CLINIC_OR_DEPARTMENT_OTHER): Payer: Self-pay | Admitting: Obstetrics & Gynecology

## 2022-05-11 NOTE — Progress Notes (Signed)
Spoke w/ via phone for pre-op interview--- pt Lab needs dos---- cbc, istat, urine preg, ekg            Lab results------ no COVID test -----patient states asymptomatic no test needed Arrive at ------- 0845 on 05-13-2022 NPO after MN NO Solid Food.  Clear liquids from MN until--- 0745 Med rec completed Medications to take morning of surgery ----- nifedipine Diabetic medication ----- n/ a Patient instructed no nail polish to be worn day of surgery Patient instructed to bring photo id and insurance card day of surgery Patient aware to have Driver (ride ) / caregiver for 24 hours after surgery --sig other, ivan Patient Special Instructions ----- n/a Pre-Op special Istructions ----- n/a Patient verbalized understanding of instructions that were given at this phone interview. Patient denies shortness of breath, chest pain, fever, cough at this phone interview.

## 2022-05-11 NOTE — Progress Notes (Signed)
Blood Pressure Check Visit  I connected with  Alison Archer on 03/31/22 at 10:40 AM EDT by mychart video and verified that I am speaking with the correct person using two identifiers.    Alison Archer met with me virtually for a blood pressure check following a vaginal delivery on 03/13/22. Patient checked BP at home and stated it was 136/95. Patient rechecked BP approximately 5-10 minutes later and it was 138/95. Patient denies any dizziness, blurred vision, headache, shortness of breath, peripheral edema. Patient is prescribed Nifedipine 60 mg daily. When I asked the patient if she takes her Nifedipine daily she stated "I don't take it everyday because I don't need it everyday- like for instance, today my blood pressure is great so I don't need it." I provided education to patient on normal vs elevated blood pressure readings. I reviewed all of this with Dr. Dione Plover. Per Dr. Dione Plover patient should take her Nifedipine daily and stop taking it two days prior to her postpartum visit where we will check her blood pressure. I reviewed this with the patient. I also reviewed signs and symptoms of pre-eclampsia with patient and encouraged the patient to continue checking her BP at home and charting it in the babyscripts app. Patient verbalized understanding and denies any other questions.    Seth Bake, RN 03/31/22

## 2022-05-12 NOTE — Anesthesia Preprocedure Evaluation (Addendum)
Anesthesia Evaluation  Patient identified by MRN, date of birth, ID band Patient awake    Reviewed: Allergy & Precautions, NPO status , Patient's Chart, lab work & pertinent test results  Airway Mallampati: I  TM Distance: >3 FB Neck ROM: Full    Dental no notable dental hx. (+) Teeth Intact, Dental Advisory Given   Pulmonary asthma , Current SmokerPatient did not abstain from smoking., former smoker,    Pulmonary exam normal breath sounds clear to auscultation       Cardiovascular Normal cardiovascular exam Rhythm:Regular Rate:Normal     Neuro/Psych    GI/Hepatic   Endo/Other  negative endocrine ROS  Renal/GU negative Renal ROS     Musculoskeletal   Abdominal (+) + obese (BMI 38.5),   Peds  Hematology   Anesthesia Other Findings NKA Delivery 02/2022  Reproductive/Obstetrics                            Anesthesia Physical Anesthesia Plan  ASA: 2  Anesthesia Plan: General   Post-op Pain Management: Toradol IV (intra-op)*, Tylenol PO (pre-op)* and Precedex   Induction: Intravenous  PONV Risk Score and Plan: 4 or greater and Treatment may vary due to age or medical condition, Ondansetron, Midazolam and Dexamethasone  Airway Management Planned: Oral ETT  Additional Equipment: None  Intra-op Plan:   Post-operative Plan: Extubation in OR  Informed Consent: I have reviewed the patients History and Physical, chart, labs and discussed the procedure including the risks, benefits and alternatives for the proposed anesthesia with the patient or authorized representative who has indicated his/her understanding and acceptance.     Dental advisory given  Plan Discussed with: CRNA  Anesthesia Plan Comments:        Anesthesia Quick Evaluation

## 2022-05-13 ENCOUNTER — Encounter (HOSPITAL_BASED_OUTPATIENT_CLINIC_OR_DEPARTMENT_OTHER): Payer: Self-pay | Admitting: Obstetrics & Gynecology

## 2022-05-13 ENCOUNTER — Ambulatory Visit (HOSPITAL_BASED_OUTPATIENT_CLINIC_OR_DEPARTMENT_OTHER): Payer: Medicaid Other | Admitting: Anesthesiology

## 2022-05-13 ENCOUNTER — Encounter (HOSPITAL_BASED_OUTPATIENT_CLINIC_OR_DEPARTMENT_OTHER)
Admission: RE | Disposition: A | Discharge status: Home / Self Care | Payer: Self-pay | Source: Home / Self Care | Attending: Obstetrics & Gynecology

## 2022-05-13 ENCOUNTER — Ambulatory Visit (HOSPITAL_BASED_OUTPATIENT_CLINIC_OR_DEPARTMENT_OTHER)
Admission: RE | Admit: 2022-05-13 | Discharge: 2022-05-13 | Disposition: A | Discharge status: Home / Self Care | Payer: Medicaid Other | Attending: Obstetrics & Gynecology | Admitting: Obstetrics & Gynecology

## 2022-05-13 DIAGNOSIS — Z302 Encounter for sterilization: Secondary | ICD-10-CM

## 2022-05-13 DIAGNOSIS — Z01818 Encounter for other preprocedural examination: Secondary | ICD-10-CM

## 2022-05-13 DIAGNOSIS — Z3009 Encounter for other general counseling and advice on contraception: Secondary | ICD-10-CM

## 2022-05-13 DIAGNOSIS — Z87891 Personal history of nicotine dependence: Secondary | ICD-10-CM | POA: Diagnosis not present

## 2022-05-13 HISTORY — DX: Exercise induced bronchospasm: J45.990

## 2022-05-13 HISTORY — DX: Thalassemia minor: D56.3

## 2022-05-13 HISTORY — DX: Personal history of other specified conditions: Z87.898

## 2022-05-13 HISTORY — PX: LAPAROSCOPIC TUBAL LIGATION: SHX1937

## 2022-05-13 HISTORY — DX: Essential (primary) hypertension: I10

## 2022-05-13 LAB — POCT PREGNANCY, URINE: Preg Test, Ur: NEGATIVE

## 2022-05-13 SURGERY — LIGATION, FALLOPIAN TUBE, LAPAROSCOPIC
Anesthesia: General | Site: Abdomen | Laterality: Bilateral

## 2022-05-13 MED ORDER — LIDOCAINE HCL (CARDIAC) PF 100 MG/5ML IV SOSY
PREFILLED_SYRINGE | INTRAVENOUS | Status: DC | PRN
  Administered 2022-05-13: 25 mg via INTRAVENOUS

## 2022-05-13 MED ORDER — IBUPROFEN 600 MG PO TABS
600.0000 mg | ORAL_TABLET | Freq: Four times a day (QID) | ORAL | 2 refills | Status: DC | PRN
Start: 2022-05-13 — End: 2022-09-22

## 2022-05-13 MED ORDER — MIDAZOLAM HCL 2 MG/2ML IJ SOLN
INTRAMUSCULAR | Status: DC | PRN
  Administered 2022-05-13: 2 mg via INTRAVENOUS

## 2022-05-13 MED ORDER — OXYCODONE HCL 5 MG PO TABS: 5.0000 mg | ORAL_TABLET | Freq: Once | ORAL | Status: DC | PRN

## 2022-05-13 MED ORDER — ONDANSETRON HCL 4 MG/2ML IJ SOLN: 4.0000 mg | Freq: Once | INTRAMUSCULAR | Status: DC | PRN

## 2022-05-13 MED ORDER — OXYCODONE HCL 5 MG PO TABS: 5.0000 mg | ORAL_TABLET | Freq: Four times a day (QID) | ORAL | 0 refills | Status: DC | PRN

## 2022-05-13 MED ORDER — SUGAMMADEX SODIUM 200 MG/2ML IV SOLN
INTRAVENOUS | Status: DC | PRN
  Administered 2022-05-13 (×2): 200 mg via INTRAVENOUS

## 2022-05-13 MED ORDER — BUPIVACAINE HCL (PF) 0.5 % IJ SOLN
INTRAMUSCULAR | Status: DC | PRN
  Administered 2022-05-13: 30 mL

## 2022-05-13 MED ORDER — KETOROLAC TROMETHAMINE 30 MG/ML IJ SOLN
INTRAMUSCULAR | Status: DC | PRN
  Administered 2022-05-13: 30 mg via INTRAVENOUS

## 2022-05-13 MED ORDER — LACTATED RINGERS IV SOLN: INTRAVENOUS | Status: DC

## 2022-05-13 MED ORDER — OXYCODONE HCL 5 MG/5ML PO SOLN: 5.0000 mg | Freq: Once | ORAL | Status: DC | PRN

## 2022-05-13 MED ORDER — FENTANYL CITRATE (PF) 100 MCG/2ML IJ SOLN
INTRAMUSCULAR | Status: AC
  Filled 2022-05-13: qty 2

## 2022-05-13 MED ORDER — GLYCOPYRROLATE 0.2 MG/ML IJ SOLN
INTRAMUSCULAR | Status: DC | PRN
  Administered 2022-05-13 (×2): .1 mg via INTRAVENOUS

## 2022-05-13 MED ORDER — FENTANYL CITRATE (PF) 100 MCG/2ML IJ SOLN
INTRAMUSCULAR | Status: DC | PRN
  Administered 2022-05-13 (×2): 50 ug via INTRAVENOUS

## 2022-05-13 MED ORDER — DEXAMETHASONE SODIUM PHOSPHATE 4 MG/ML IJ SOLN
INTRAMUSCULAR | Status: DC | PRN
  Administered 2022-05-13: 8 mg via INTRAVENOUS

## 2022-05-13 MED ORDER — ONDANSETRON HCL 4 MG/2ML IJ SOLN
INTRAMUSCULAR | Status: DC | PRN
  Administered 2022-05-13: 4 mg via INTRAVENOUS

## 2022-05-13 MED ORDER — ROCURONIUM BROMIDE 100 MG/10ML IV SOLN
INTRAVENOUS | Status: DC | PRN
  Administered 2022-05-13: 60 mg via INTRAVENOUS

## 2022-05-13 MED ORDER — DOCUSATE SODIUM 100 MG PO CAPS: 100.0000 mg | ORAL_CAPSULE | Freq: Two times a day (BID) | ORAL | 2 refills | Status: DC | PRN

## 2022-05-13 MED ORDER — DEXMEDETOMIDINE (PRECEDEX) IN NS 20 MCG/5ML (4 MCG/ML) IV SYRINGE
PREFILLED_SYRINGE | INTRAVENOUS | Status: DC | PRN
  Administered 2022-05-13 (×2): 4 ug via INTRAVENOUS
  Administered 2022-05-13: 8 ug via INTRAVENOUS

## 2022-05-13 MED ORDER — PROPOFOL 10 MG/ML IV BOLUS
INTRAVENOUS | Status: DC | PRN
  Administered 2022-05-13: 20 mg via INTRAVENOUS
  Administered 2022-05-13: 150 mg via INTRAVENOUS
  Administered 2022-05-13: 20 mg via INTRAVENOUS

## 2022-05-13 MED ORDER — GABAPENTIN 300 MG PO CAPS
300.0000 mg | ORAL_CAPSULE | ORAL | Status: AC
  Administered 2022-05-13: 300 mg via ORAL

## 2022-05-13 MED ORDER — HYDROMORPHONE HCL 1 MG/ML IJ SOLN: 0.2500 mg | INTRAMUSCULAR | Status: DC | PRN

## 2022-05-13 MED ORDER — GABAPENTIN 300 MG PO CAPS
ORAL_CAPSULE | ORAL | Status: AC
  Filled 2022-05-13: qty 1

## 2022-05-13 MED ORDER — ACETAMINOPHEN 500 MG PO TABS
1000.0000 mg | ORAL_TABLET | ORAL | Status: AC
  Administered 2022-05-13: 1000 mg via ORAL

## 2022-05-13 MED ORDER — ACETAMINOPHEN 500 MG PO TABS
ORAL_TABLET | ORAL | Status: AC
  Filled 2022-05-13: qty 2

## 2022-05-13 MED ORDER — KETOROLAC TROMETHAMINE 30 MG/ML IJ SOLN: 30.0000 mg | Freq: Once | INTRAMUSCULAR | Status: DC | PRN

## 2022-05-13 MED ORDER — SODIUM CHLORIDE 0.9 % IR SOLN
Status: DC | PRN
  Administered 2022-05-13: 1000 mL

## 2022-05-13 MED ORDER — MIDAZOLAM HCL 2 MG/2ML IJ SOLN
INTRAMUSCULAR | Status: AC
  Filled 2022-05-13: qty 2

## 2022-05-13 SURGICAL SUPPLY — 32 items
ADH SKN CLS APL DERMABOND .7 (GAUZE/BANDAGES/DRESSINGS) ×1
CABLE HIGH FREQUENCY MONO STRZ (ELECTRODE) IMPLANT
CATH ROBINSON RED A/P 16FR (CATHETERS) ×2 IMPLANT
CLIP FILSHIE TUBAL LIGA STRL (Clip) ×1 IMPLANT
COVER MAYO STAND STRL (DRAPES) ×2 IMPLANT
DERMABOND ADVANCED (GAUZE/BANDAGES/DRESSINGS) ×1
DERMABOND ADVANCED .7 DNX12 (GAUZE/BANDAGES/DRESSINGS) ×1 IMPLANT
DRSG OPSITE POSTOP 3X4 (GAUZE/BANDAGES/DRESSINGS) IMPLANT
DURAPREP 26ML APPLICATOR (WOUND CARE) ×2 IMPLANT
GAUZE 4X4 16PLY ~~LOC~~+RFID DBL (SPONGE) ×4 IMPLANT
GLOVE BIOGEL PI IND STRL 7.0 (GLOVE) ×4 IMPLANT
GLOVE BIOGEL PI INDICATOR 7.0 (GLOVE) ×4
GLOVE ECLIPSE 7.0 STRL STRAW (GLOVE) ×2 IMPLANT
GOWN STRL REUS W/TWL XL LVL3 (GOWN DISPOSABLE) ×4 IMPLANT
KIT TURNOVER CYSTO (KITS) ×2 IMPLANT
NS IRRIG 1000ML POUR BTL (IV SOLUTION) ×2 IMPLANT
PACK LAPAROSCOPY BASIN (CUSTOM PROCEDURE TRAY) ×2 IMPLANT
PACK TRENDGUARD 450 HYBRID PRO (MISCELLANEOUS) IMPLANT
PROTECTOR NERVE ULNAR (MISCELLANEOUS) ×4 IMPLANT
SET SUCTION IRRIG HYDROSURG (IRRIGATION / IRRIGATOR) IMPLANT
SET TUBE SMOKE EVAC HIGH FLOW (TUBING) ×2 IMPLANT
SHEARS HARMONIC ACE PLUS 36CM (ENDOMECHANICALS) IMPLANT
SLEEVE XCEL OPT CAN 5 100 (ENDOMECHANICALS) ×2 IMPLANT
SUT MNCRL AB 4-0 PS2 18 (SUTURE) ×2 IMPLANT
SUT VICRYL 0 UR6 27IN ABS (SUTURE) IMPLANT
SYS BAG RETRIEVAL 10MM (BASKET)
SYSTEM BAG RETRIEVAL 10MM (BASKET) IMPLANT
TOWEL OR 17X26 10 PK STRL BLUE (TOWEL DISPOSABLE) ×2 IMPLANT
TRAY FOLEY W/BAG SLVR 14FR LF (SET/KITS/TRAYS/PACK) IMPLANT
TRENDGUARD 450 HYBRID PRO PACK (MISCELLANEOUS)
TROCAR BLADELESS OPT 5 100 (ENDOMECHANICALS) ×2 IMPLANT
TROCAR XCEL NON-BLD 11X100MML (ENDOMECHANICALS) ×2 IMPLANT

## 2022-05-13 NOTE — Discharge Instructions (Signed)
  Post Anesthesia Home Care Instructions  Activity: Get plenty of rest for the remainder of the day. A responsible individual must stay with you for 24 hours following the procedure.  For the next 24 hours, DO NOT: -Drive a car -Paediatric nurse -Drink alcoholic beverages -Take any medication unless instructed by your physician -Make any legal decisions or sign important papers.  Meals: Start with liquid foods such as gelatin or soup. Progress to regular foods as tolerated. Avoid greasy, spicy, heavy foods. If nausea and/or vomiting occur, drink only clear liquids until the nausea and/or vomiting subsides. Call your physician if vomiting continues.  Special Instructions/Symptoms: Your throat may feel dry or sore from the anesthesia or the breathing tube placed in your throat during surgery. If this causes discomfort, gargle with warm salt water. The discomfort should disappear within 24 hours.  Tylenol may be taken after 3:30 pm today if needed Ibuprofen, motrin, aleve, advil, tordal or any nsaids until after 4:00 pm if needed

## 2022-05-13 NOTE — H&P (Signed)
Preoperative History and Physical  Alison Archer is a 31 y.o. H2C9470 here for surgical management of undesired fertility.   No significant preoperative concerns.  Proposed surgery:  Laparoscopic bilateral tubal sterilization using Filshie clips   Past Medical History:  Diagnosis Date   Alpha thalassemia silent carrier    Asthma, exercise induced    05-11-2022  per pt inhaler prn   History of febrile seizure    febrile as a child   Hypertension    Past Surgical History:  Procedure Laterality Date   CESAREAN SECTION N/A 04/07/2013   Procedure: Primary cesarean section with delivery of baby boy at 71.  Apgars 4/7.;  Surgeon: Lavonia Drafts, MD;  Location: [facility];  Service: Obstetrics;  Laterality: N/A;   OB History  Gravida Para Term Preterm AB Living  '2 2 2 '$ 0 0 2  SAB IAB Ectopic Multiple Live Births  0 0 0 0 2    # Outcome Date GA Lbr Len/2nd Weight Sex Delivery Anes PTL Lv  2 Term 03/13/22 [redacted]w[redacted]d/ 03:12 3480 g F Vag-Spont EPI  LIV  1 Term 04/07/13 [id number] g M CS-LTranv Other, EPI  LIV     Birth Comments: none  Patient denies any other pertinent gynecologic issues.   No current facility-administered medications on file prior to encounter.   Current Outpatient Medications on File Prior to Encounter  Medication Sig Dispense Refill   ALBUTEROL IN Inhale into the lungs as needed.     ibuprofen (ADVIL) 600 MG tablet Take 1 tablet (600 mg total) by mouth every 6 (six) hours. 30 tablet 0   NIFEdipine (ADALAT CC) 60 MG 24 hr tablet Take 1 tablet (60 mg total) by mouth daily. (Patient taking differently: Take 60 mg by mouth daily.) 30 tablet 0   Blood Pressure Monitoring (BLOOD PRESSURE MONITOR AUTOMAT) DEVI 1 Device by Does not apply route daily. Automatic blood pressure cuff regular size. To monitor blood pressure regularly at home. ICD-10 code: O09.90 1 each 0   No Known Allergies  Social History:   reports that she quit smoking about 2 years ago. Her smoking  use included cigarettes. She has never used smokeless tobacco. She reports that she does not currently use drugs after having used the following drugs: Marijuana. She reports that she does not drink alcohol.  Family History  Problem Relation Age of Onset   Hypertension Mother    Hypertension Father    Asthma Sister    Hypertension Sister    Diabetes Maternal Grandmother     Review of Systems: Pertinent items noted in HPI and remainder of comprehensive ROS otherwise negative.  PHYSICAL EXAM: Height 5' 5.5" (1.664 m), weight 106.6 kg, last menstrual period 04/29/2022, not currently breastfeeding. CONSTITUTIONAL: Well-developed, well-nourished female in no acute distress.  HENT:  Normocephalic, atraumatic, External right and left ear normal. Oropharynx is clear and moist EYES: Conjunctivae and EOM are normal. Pupils are equal, round, and reactive to light. No scleral icterus.  NECK: Normal range of motion, supple, no masses SKIN: Skin is warm and dry. No rash noted. Not diaphoretic. No erythema. No pallor. NEUROLOGIC: Alert and oriented to person, place, and time. Normal reflexes, muscle tone coordination. No cranial nerve deficit noted. PSYCHIATRIC: Normal mood and affect. Normal behavior. Normal judgment and thought content. CARDIOVASCULAR: Normal heart rate noted, regular rhythm RESPIRATORY: Effort and breath sounds normal, no problems with respiration noted ABDOMEN: Soft, nontender, nondistended. PELVIC: Deferred MUSCULOSKELETAL: Normal range of motion. No  edema and no tenderness. 2+ distal pulses.  Labs: Results for orders placed or performed during the hospital encounter of 05/13/22 (from the past 336 hour(s))  Pregnancy, urine POC   Collection Time: 05/13/22  8:41 AM  Result Value Ref Range   Preg Test, Ur NEGATIVE NEGATIVE    Assessment: Principal Problem:   Request for sterilization   Plan: Patient will undergo surgical management with  laparoscopic bilateral tubal  sterilization using Filshie clips.   Risks and benefits discussed in detail including but not limited to: risk of regret, permanence of method, bleeding, infection, injury to surrounding organs and need for additional procedures.  Failure risk of about 1% for Filshie clips with increased risk of ectopic gestation if pregnancy occurs was also discussed with patient.  Also discussed possibility of post-tubal syndrome with increased pelvic pain or menstrual irregularities.  Patient verbalized understanding of these risks and benefits and wants to proceed with sterilization with laparoscopic bilateral sterilization using Filshie clips. All questions were answered. Routine postoperative instructions will be reviewed with the patient and her family in detail after surgery.  The patient concurred with the proposed plan, giving informed written consent for the surgery.  Patient has been NPO since last night and she will remain NPO for procedure.  Anesthesia and OR aware.  Preoperative prophylactic SCDs ordered on call to the OR.  To OR when ready.    Verita Schneiders, MD, [place] for [employer], [place]

## 2022-05-13 NOTE — Anesthesia Postprocedure Evaluation (Signed)
Anesthesia Post Note  Patient: Alison Archer  Procedure(s) Performed: LAPAROSCOPIC TUBAL LIGATION WITH FILSHIE CLIPS (Bilateral: Abdomen)     Patient location during evaluation: PACU Anesthesia Type: General Level of consciousness: awake and alert Pain management: pain level controlled Vital Signs Assessment: post-procedure vital signs reviewed and stable Respiratory status: spontaneous breathing, nonlabored ventilation, respiratory function stable and patient connected to nasal cannula oxygen Cardiovascular status: blood pressure returned to baseline and stable Postop Assessment: no apparent nausea or vomiting Anesthetic complications: no   No notable events documented.  Last Vitals:  Vitals:   05/13/22 1100 05/13/22 1130  BP: 125/76 129/81  Pulse: 79 78  Resp: 20 18  Temp:  36.5 C  SpO2: 95% 100%    Last Pain:  Vitals:   05/13/22 1130  TempSrc: Oral  PainSc:                  [place]et [place]

## 2022-05-13 NOTE — Transfer of Care (Signed)
Immediate Anesthesia Transfer of Care Note  Patient: Alison Archer  Procedure(s) Performed: LAPAROSCOPIC TUBAL LIGATION WITH FILSHIE CLIPS (Bilateral: Abdomen)  Patient Location: PACU  Anesthesia Type:General  Level of Consciousness: awake, alert , oriented and patient cooperative  Airway & Oxygen Therapy: Patient Spontanous Breathing  Post-op Assessment: Report given to RN and Post -op Vital signs reviewed and stable  Post vital signs: Reviewed and stable  Last Vitals:  Vitals Value Taken Time  BP    Temp    Pulse 82 05/13/22 1039  Resp 17 05/13/22 1039  SpO2 95 % 05/13/22 1039  Vitals shown include unvalidated device data.  Last Pain:  Vitals:   05/13/22 0915  TempSrc: Oral  PainSc: 0-No pain      Patients Stated Pain Goal: 6 (29/24/46 2863)  Complications: No notable events documented.

## 2022-05-13 NOTE — Anesthesia Procedure Notes (Signed)
Procedure Name: Intubation Date/Time: 05/13/2022 9:48 AM  Performed by: Georgeanne Nim, CRNAPre-anesthesia Checklist: Patient identified, Emergency Drugs available, Suction available, Patient being monitored and Timeout performed Patient Re-evaluated:Patient Re-evaluated prior to induction Oxygen Delivery Method: Circle system utilized Preoxygenation: Pre-oxygenation with 100% oxygen Induction Type: IV induction Ventilation: Mask ventilation without difficulty Laryngoscope Size: Mac and 4 Grade View: Grade I Tube type: Oral Tube size: 7.0 mm Number of attempts: 1 Placement Confirmation: ETT inserted through vocal cords under direct vision, positive ETCO2, CO2 detector and breath sounds checked- equal and bilateral Secured at: 21 cm Tube secured with: Tape Dental Injury: Teeth and Oropharynx as per pre-operative assessment

## 2022-05-14 ENCOUNTER — Encounter (HOSPITAL_BASED_OUTPATIENT_CLINIC_OR_DEPARTMENT_OTHER): Payer: Self-pay | Admitting: Obstetrics & Gynecology

## 2022-05-17 ENCOUNTER — Telehealth (INDEPENDENT_AMBULATORY_CARE_PROVIDER_SITE_OTHER): Payer: Medicaid Other | Admitting: Family Medicine

## 2022-05-17 ENCOUNTER — Encounter: Payer: Self-pay | Admitting: Family Medicine

## 2022-05-17 DIAGNOSIS — O34219 Maternal care for unspecified type scar from previous cesarean delivery: Secondary | ICD-10-CM

## 2022-05-17 DIAGNOSIS — O1400 Mild to moderate pre-eclampsia, unspecified trimester: Secondary | ICD-10-CM

## 2022-06-10 ENCOUNTER — Ambulatory Visit: Payer: Medicaid Other | Admitting: Obstetrics & Gynecology

## 2022-09-22 ENCOUNTER — Ambulatory Visit (HOSPITAL_COMMUNITY)
Admission: EM | Admit: 2022-09-22 | Discharge: 2022-09-22 | Disposition: A | Discharge status: Home / Self Care | Payer: Medicaid Other | Attending: Emergency Medicine | Admitting: Emergency Medicine

## 2022-09-22 ENCOUNTER — Encounter (HOSPITAL_COMMUNITY): Payer: Self-pay

## 2022-09-22 DIAGNOSIS — L02412 Cutaneous abscess of left axilla: Secondary | ICD-10-CM

## 2022-09-22 MED ORDER — DOXYCYCLINE HYCLATE 100 MG PO CAPS: 100.0000 mg | ORAL_CAPSULE | Freq: Two times a day (BID) | ORAL | 0 refills | Status: DC

## 2022-09-22 MED ORDER — IBUPROFEN 800 MG PO TABS: 800.0000 mg | ORAL_TABLET | Freq: Three times a day (TID) | ORAL | 0 refills | Status: DC

## 2022-09-22 NOTE — ED Triage Notes (Signed)
Pt is here for abscess under left arm x4-5 days causing pain, drainage and redness

## 2022-09-22 NOTE — ED Provider Notes (Signed)
[place]    CSN: [id number] Arrival date & time: 09/22/22  1407      History   Chief Complaint Chief Complaint  Patient presents with   Abscess    HPI Alison Archer is a 31 y.o. female.   Patient presents with cyst to the left axilla for 4 to 5 days.  Associated pain, erythema, swelling and fevers.  Pain has steadily increased making it difficult to raise arm above head.  Endorses drainage with a foul odor.  Has attempted use of Jamaican black castor oil which has been somewhat helpful.   Past Medical History:  Diagnosis Date   Alpha thalassemia silent carrier    Asthma, exercise induced    05-11-2022  per pt inhaler prn   History of febrile seizure    febrile as a child   Hypertension    Pre-eclampsia, mild 03/13/2022    Patient Active Problem List   Diagnosis Date Noted   Request for sterilization 05/13/2022   VBAC (vaginal birth after Cesarean) 03/13/2022   Obesity in pregnancy 12/30/2021    Past Surgical History:  Procedure Laterality Date   CESAREAN SECTION N/A 04/07/2013   Procedure: Primary cesarean section with delivery of baby boy at 63.  Apgars 4/7.;  Surgeon: Lavonia Drafts, MD;  Location: [facility];  Service: Obstetrics;  Laterality: N/A;   LAPAROSCOPIC TUBAL LIGATION Bilateral 05/13/2022   Procedure: LAPAROSCOPIC TUBAL LIGATION WITH FILSHIE CLIPS;  Surgeon: Osborne Oman, MD;  Location: [facility];  Service: Gynecology;  Laterality: Bilateral;    OB History     Gravida  2   Para  2   Term  2   Preterm  0   AB  0   Living  2      SAB  0   IAB  0   Ectopic  0   Multiple  0   Live Births  2            Home Medications    Prior to Admission medications   Medication Sig Start Date End Date Taking? Authorizing Provider  doxycycline (VIBRAMYCIN) 100 MG capsule Take 1 capsule (100 mg total) by mouth 2 (two) times daily. 09/22/22  Yes Fardowsa Authier R, NP  ibuprofen (ADVIL) 800 MG tablet  Take 1 tablet (800 mg total) by mouth 3 (three) times daily. 09/22/22  Yes Tamiki Kuba, Leitha Schuller, NP  ALBUTEROL IN Inhale into the lungs as needed.    [provider]    Family History Family History  Problem Relation Age of Onset   Hypertension Mother    Hypertension Father    Asthma Sister    Hypertension Sister    Diabetes Maternal Grandmother     Social History Social History   Tobacco Use   Smoking status: Former    Years: 2.00    Types: Cigarettes    Quit date: 01/09/2020    Years since quitting: 2.7   Smokeless tobacco: Never  Vaping Use   Vaping Use: Never used  Substance Use Topics   Alcohol use: No   Drug use: Not Currently    Types: Marijuana     Allergies   Patient has no known allergies.   Review of Systems Review of Systems  Constitutional: Negative.   Respiratory: Negative.    Cardiovascular: Negative.   Skin:  Positive for wound. Negative for color change, pallor and rash.  Neurological: Negative.      Physical Exam Triage Vital Signs  ED Triage Vitals  Enc Vitals Group     BP 09/22/22 1447 124/67     Pulse Rate 09/22/22 1447 72     Resp 09/22/22 1447 16     Temp 09/22/22 1447 99.5 F (37.5 C)     Temp Source 09/22/22 1447 Oral     SpO2 09/22/22 1447 98 %     Weight --      Height --      Head Circumference --      Peak Flow --      Pain Score 09/22/22 1444 10     Pain Loc --      Pain Edu? --      Excl. in [place]? --    No data found.  Updated Vital Signs BP 124/67 (BP Location: Right Arm)   Pulse 72   Temp 99.5 F (37.5 C) (Oral)   Resp 16   LMP 09/22/2022   SpO2 98%   Visual Acuity Right Eye Distance:   Left Eye Distance:   Bilateral Distance:    Right Eye Near:   Left Eye Near:    Bilateral Near:     Physical Exam Constitutional:      Appearance: Normal appearance.  HENT:     Head: Normocephalic.  Eyes:     Extraocular Movements: Extraocular movements intact.  Pulmonary:     Effort: Pulmonary effort is  normal.  Skin:    Comments: Approximately 2 x 3 abscess present to the left axilla with yellow purulent drainage present, erythematous and tender, limited view as patient is giving minimal effort to lift arm due to pain elicited   Neurological:     Mental Status: She is alert and oriented to person, place, and time. Mental status is at baseline.  Psychiatric:        Mood and Affect: Mood normal.        Behavior: Behavior normal.      UC Treatments / Results  Labs (all labs ordered are listed, but only abnormal results are displayed) Labs Reviewed - No data to display  EKG   Radiology No results found.  Procedures Procedures (including critical care time)  Medications Ordered in UC Medications - No data to display  Initial Impression / Assessment and Plan / UC Course  I have reviewed the triage vital signs and the nursing notes.  Pertinent labs & imaging results that were available during my care of the patient were reviewed by me and considered in my medical decision making (see chart for details).  Abscess of left axilla  Small amount of drainage noted, declined incision and drainage to open space, doxycycline prescribed as well as ibuprofen 800 mg for management of discomfort, recommended warm compresses over the affected area to help soften tissue, given strict precautions for nonhealing site to return to urgent care for reevaluation, work note given Final Clinical Impressions(s) / UC Diagnoses   Final diagnoses:  Abscess of left axilla     Discharge Instructions      Take doxycycline every morning and every evening for 7 days  May use ibuprofen 800 mg every 8 hours as needed for pain, may take Tylenol 500 to 1000 mg every 6 hours in addition as needed  Hold warm-hot compresses to affected area at least 4 times a day, this helps to facilitate draining, the more the better  Please return for evaluation for increased swelling, increased tenderness or pain, non  healing site, non draining site, you begin to  have fever or chills   We reviewed the etiology of recurrent abscesses of skin.  Skin abscesses are collections of pus within the dermis and deeper skin tissues. Skin abscesses manifest as painful, tender, fluctuant, and erythematous nodules, frequently surmounted by a pustule and surrounded by a rim of erythematous swelling.  Spontaneous drainage of purulent material may occur.  Fever can occur on occasion.    -Skin abscesses can develop in healthy individuals with no predisposing conditions other than skin or nasal carriage of Staphylococcus aureus.  Individuals in close contact with others who have active infection with skin abscesses are at increased risk which is likely to explain why twin brother has similar episodes.   In addition, any process leading to a breach in the skin barrier can also predispose to the development of a skin abscesses, such as atopic dermatitis.      ED Prescriptions     Medication Sig Dispense Auth. Provider   doxycycline (VIBRAMYCIN) 100 MG capsule Take 1 capsule (100 mg total) by mouth 2 (two) times daily. 14 capsule Ailyn Gladd R, NP   ibuprofen (ADVIL) 800 MG tablet Take 1 tablet (800 mg total) by mouth 3 (three) times daily. 21 tablet Lockie Bothun, Leitha Schuller, NP      PDMP not reviewed this encounter.   Hans Eden, NP 09/22/22 1503

## 2022-09-22 NOTE — Discharge Instructions (Signed)
Take doxycycline every morning and every evening for 7 days  May use ibuprofen 800 mg every 8 hours as needed for pain, may take Tylenol 500 to 1000 mg every 6 hours in addition as needed  Hold warm-hot compresses to affected area at least 4 times a day, this helps to facilitate draining, the more the better  Please return for evaluation for increased swelling, increased tenderness or pain, non healing site, non draining site, you begin to have fever or chills   We reviewed the etiology of recurrent abscesses of skin.  Skin abscesses are collections of pus within the dermis and deeper skin tissues. Skin abscesses manifest as painful, tender, fluctuant, and erythematous nodules, frequently surmounted by a pustule and surrounded by a rim of erythematous swelling.  Spontaneous drainage of purulent material may occur.  Fever can occur on occasion.    -Skin abscesses can develop in healthy individuals with no predisposing conditions other than skin or nasal carriage of Staphylococcus aureus.  Individuals in close contact with others who have active infection with skin abscesses are at increased risk which is likely to explain why twin brother has similar episodes.   In addition, any process leading to a breach in the skin barrier can also predispose to the development of a skin abscesses, such as atopic dermatitis.

## 2023-01-01 ENCOUNTER — Emergency Department (HOSPITAL_COMMUNITY)
Admission: EM | Admit: 2023-01-01 | Discharge: 2023-01-01 | Disposition: A | Discharge status: Home / Self Care | Payer: Medicaid Other | Attending: Emergency Medicine | Admitting: Emergency Medicine

## 2023-01-01 ENCOUNTER — Other Ambulatory Visit: Payer: Self-pay

## 2023-01-01 DIAGNOSIS — R21 Rash and other nonspecific skin eruption: Secondary | ICD-10-CM | POA: Insufficient documentation

## 2023-01-01 MED ORDER — PREDNISONE 10 MG PO TABS: 20.0000 mg | ORAL_TABLET | Freq: Every day | ORAL | 0 refills | Status: DC

## 2023-01-01 MED ORDER — TRIAMCINOLONE ACETONIDE 0.1 % EX CREA: 1.0000 | TOPICAL_CREAM | Freq: Two times a day (BID) | CUTANEOUS | 0 refills | Status: AC

## 2023-01-01 NOTE — ED Notes (Signed)
AVS reviewed with pt prior to discharge. Pt verbalizes understanding. Belongings with pt upon depart. Ambulatory to POV by self.

## 2023-01-01 NOTE — ED Triage Notes (Signed)
Pt to ED from home. Pt c/o rash that started about a week ago on both hands and chest. Rash has come and gone. Pt states left hand is the worst and is very painful. States moisturizer and lotion provide little relief. A/O x 4

## 2023-01-01 NOTE — ED Provider Notes (Signed)
[facility] Provider Note   CSN: [id number] Arrival date & time: 01/01/23  0901     History  Chief Complaint  Patient presents with   Rash    Alison Archer is a 32 y.o. female.   Rash    This is a 32 year old female presenting to the emergency department due to rash. For the last few weeks, she noticed it mostly on her right hand but is also been on the left hand as well as the left side of her neck.  It comes and goes.  It is pruritic, erythematous but has not really had any drainage.  Has not had any fevers, no recent antibiotic use, she has tried Vaseline but that has not helped.  She denies any changes in lotions, make-up, skin care routine.  She works as a Scientist, water quality, nobody at work or at home are sick or have similar symptoms.  Home Medications Prior to Admission medications   Medication Sig Start Date End Date Taking? Authorizing Provider  predniSONE (DELTASONE) 10 MG tablet Take 2 tablets (20 mg total) by mouth daily. 01/01/23  Yes Sherrill Raring, PA-C  triamcinolone cream (KENALOG) 0.1 % Apply 1 Application topically 2 (two) times daily. 01/01/23  Yes Sherrill Raring, PA-C  ALBUTEROL IN Inhale into the lungs as needed.    [provider]  doxycycline (VIBRAMYCIN) 100 MG capsule Take 1 capsule (100 mg total) by mouth 2 (two) times daily. 09/22/22   White, Leitha Schuller, NP  ibuprofen (ADVIL) 800 MG tablet Take 1 tablet (800 mg total) by mouth 3 (three) times daily. 09/22/22   Hans Eden, NP      Allergies    Patient has no known allergies.    Review of Systems   Review of Systems  Skin:  Positive for rash.    Physical Exam Updated Vital Signs BP 125/81   Pulse 99   Temp 98.1 F (36.7 C) (Oral)   Resp 18   Ht 5' 5"$  (1.651 m)   Wt 102.1 kg   SpO2 100%   BMI 37.44 kg/m  Physical Exam Vitals and nursing note reviewed. Exam conducted with a chaperone present.  Constitutional:      General: She is not in acute  distress.    Appearance: Normal appearance.  HENT:     Head: Normocephalic and atraumatic.  Eyes:     General: No scleral icterus.    Extraocular Movements: Extraocular movements intact.     Pupils: Pupils are equal, round, and reactive to light.  Skin:    Coloration: Skin is not jaundiced.     Findings: Rash present.     Comments: See attached photos, vesicular lesions to dorsum of right and left hand.  Also on left side of neck.  There is no mucosal involvement  Neurological:     Mental Status: She is alert. Mental status is at baseline.     Coordination: Coordination normal.             ED Results / Procedures / Treatments   Labs (all labs ordered are listed, but only abnormal results are displayed) Labs Reviewed - No data to display  EKG None  Radiology No results found.  Procedures Procedures    Medications Ordered in ED Medications - No data to display  ED Course/ Medical Decision Making/ A&P  Medical Decision Making Risk Prescription drug management.   This is a 32 year old female presenting to the emergency department due to rash.  There is no mucosal involvement or antibiotic use, I do not think this is TEN or SJS.  Is not a dermatomal distribution and is not consistent with zoster, I do not see any obvious linear score sedations and I do not think this is scabies.  Suspect likely a contact dermatitis, will cover with steroid burst and triamcinolone cream.  Will have patient follow-up closely with PCP, return precautions were discussed.  Patient is stable for discharge.        Final Clinical Impression(s) / ED Diagnoses Final diagnoses:  Rash    Rx / DC Orders ED Discharge Orders          Ordered    triamcinolone cream (KENALOG) 0.1 %  2 times daily        01/01/23 0936    predniSONE (DELTASONE) 10 MG tablet  Daily        01/01/23 0936              Sherrill Raring, PA-C 01/01/23 0941    Wyvonnia Dusky, MD 01/01/23 1041

## 2023-01-01 NOTE — Discharge Instructions (Signed)
Take 40 mg of prednisone for the next 5 days.  Use the triamcinolone cream to the affected area twice daily until it improves.  See your primary in the next week for skin recheck.  Return to the ED if it spreads to the mucosa, you have fevers, new or concerning symptoms.

## 2023-01-20 ENCOUNTER — Encounter (HOSPITAL_COMMUNITY): Payer: Self-pay

## 2023-01-20 ENCOUNTER — Ambulatory Visit (HOSPITAL_COMMUNITY)
Admission: EM | Admit: 2023-01-20 | Discharge: 2023-01-20 | Disposition: A | Discharge status: Home / Self Care | Payer: Medicaid Other | Attending: Family Medicine | Admitting: Family Medicine

## 2023-01-20 DIAGNOSIS — J020 Streptococcal pharyngitis: Secondary | ICD-10-CM

## 2023-01-20 DIAGNOSIS — J4521 Mild intermittent asthma with (acute) exacerbation: Secondary | ICD-10-CM | POA: Diagnosis not present

## 2023-01-20 HISTORY — DX: Dermatitis, unspecified: L30.9

## 2023-01-20 LAB — POCT RAPID STREP A, ED / UC: Streptococcus, Group A Screen (Direct): POSITIVE — AB

## 2023-01-20 MED ORDER — PREDNISONE 20 MG PO TABS: 40.0000 mg | ORAL_TABLET | Freq: Every day | ORAL | 0 refills | Status: AC

## 2023-01-20 MED ORDER — AMOXICILLIN 875 MG PO TABS: 875.0000 mg | ORAL_TABLET | Freq: Two times a day (BID) | ORAL | 0 refills | Status: AC

## 2023-01-20 MED ORDER — ALBUTEROL SULFATE HFA 108 (90 BASE) MCG/ACT IN AERS: 2.0000 | INHALATION_SPRAY | RESPIRATORY_TRACT | 0 refills | Status: AC | PRN

## 2023-01-20 MED ORDER — AMOXICILLIN 875 MG PO TABS
875.0000 mg | ORAL_TABLET | Freq: Two times a day (BID) | ORAL | 0 refills | Status: DC
Start: 2023-01-20 — End: 2023-01-20

## 2023-01-20 NOTE — ED Provider Notes (Signed)
[facility]    CSN: [id number] Arrival date & time: 01/20/23  1538      History   Chief Complaint Chief Complaint  Patient presents with   Sore Throat   sneezing   Cough    HPI Alison Archer is a 32 y.o. female.    Sore Throat  Cough  Here with sore throat and congestion and cough.  Symptoms began yesterday, first with the throat bothering her.  She has some malaise and is aching.  No nausea or vomiting or diarrhea.  No fever or chills.  Last menstrual cycle was February 20.  She has a history of asthma and her asthma has been worse since this started.  She does need a new inhaler.  She also has had some eczema and rash on her hands.  She was seen at the emergency room on 10 February, and prednisone and cream helped it improved, but it is worse again.  She does not have primary care at this point  Past Medical History:  Diagnosis Date   Alpha thalassemia silent carrier    Asthma, exercise induced    05-11-2022  per pt inhaler prn   Eczema    History of febrile seizure    febrile as a child   Hypertension    Pre-eclampsia, mild 03/13/2022    Patient Active Problem List   Diagnosis Date Noted   Request for sterilization 05/13/2022   VBAC (vaginal birth after Cesarean) 03/13/2022   Obesity in pregnancy 12/30/2021    Past Surgical History:  Procedure Laterality Date   CESAREAN SECTION N/A 04/07/2013   Procedure: Primary cesarean section with delivery of baby boy at 29.  Apgars 4/7.;  Surgeon: Lavonia Drafts, MD;  Location: [facility];  Service: Obstetrics;  Laterality: N/A;   LAPAROSCOPIC TUBAL LIGATION Bilateral 05/13/2022   Procedure: LAPAROSCOPIC TUBAL LIGATION WITH FILSHIE CLIPS;  Surgeon: Osborne Oman, MD;  Location: [facility];  Service: Gynecology;  Laterality: Bilateral;    OB History     Gravida  2   Para  2   Term  2   Preterm  0   AB  0   Living  2      SAB  0   IAB  0   Ectopic  0    Multiple  0   Live Births  2            Home Medications    Prior to Admission medications   Medication Sig Start Date End Date Taking? Authorizing Provider  albuterol (VENTOLIN HFA) 108 (90 Base) MCG/ACT inhaler Inhale 2 puffs into the lungs every 4 (four) hours as needed for wheezing or shortness of breath. 01/20/23  Yes [place] Paone, Gwenlyn Perking, MD  predniSONE (DELTASONE) 20 MG tablet Take 2 tablets (40 mg total) by mouth daily with breakfast for 5 days. 01/20/23 01/25/23 Yes Barrett Henle, MD  ALBUTEROL IN Inhale into the lungs as needed.    [provider]  amoxicillin (AMOXIL) 875 MG tablet Take 1 tablet (875 mg total) by mouth 2 (two) times daily for 10 days. 01/20/23 01/30/23  Barrett Henle, MD  triamcinolone cream (KENALOG) 0.1 % Apply 1 Application topically 2 (two) times daily. 01/01/23   Sherrill Raring, PA-C    Family History Family History  Problem Relation Age of Onset   Hypertension Mother    Hypertension Father    Asthma Sister    Hypertension Sister    Diabetes Maternal  Grandmother     Social History Social History   Tobacco Use   Smoking status: Former    Years: 2.00    Types: Cigarettes    Quit date: 01/09/2020    Years since quitting: 3.0   Smokeless tobacco: Never  Vaping Use   Vaping Use: Never used  Substance Use Topics   Alcohol use: No   Drug use: Not Currently    Types: Marijuana     Allergies   Patient has no known allergies.   Review of Systems Review of Systems  Respiratory:  Positive for cough.      Physical Exam Triage Vital Signs ED Triage Vitals [01/20/23 1627]  Enc Vitals Group     BP 137/84     Pulse Rate 96     Resp 16     Temp 98.1 F (36.7 C)     Temp Source Oral     SpO2 96 %     Weight      Height      Head Circumference      Peak Flow      Pain Score 8     Pain Loc      Pain Edu?      Excl. in [place]?    No data found.  Updated Vital Signs BP 137/84 (BP Location: Left Arm)   Pulse 96   Temp  98.1 F (36.7 C) (Oral)   Resp 16   LMP 01/11/2023   SpO2 96%   Visual Acuity Right Eye Distance:   Left Eye Distance:   Bilateral Distance:    Right Eye Near:   Left Eye Near:    Bilateral Near:     Physical Exam Vitals reviewed.  Constitutional:      General: She is not in acute distress.    Appearance: She is not toxic-appearing.  HENT:     Right Ear: Tympanic membrane and ear canal normal.     Left Ear: Tympanic membrane and ear canal normal.     Nose: Congestion present.     Mouth/Throat:     Mouth: Mucous membranes are moist.     Comments: Tonsils are erythematous and 2+ per 2 feet.  No asymmetry.  There is clear mucus draining in the oropharynx. Eyes:     Extraocular Movements: Extraocular movements intact.     Conjunctiva/sclera: Conjunctivae normal.     Pupils: Pupils are equal, round, and reactive to light.  Cardiovascular:     Rate and Rhythm: Normal rate and regular rhythm.     Heart sounds: No murmur heard. Pulmonary:     Effort: Pulmonary effort is normal. No respiratory distress.     Breath sounds: No stridor. No wheezing, rhonchi or rales.  Musculoskeletal:     Cervical back: Neck supple.  Lymphadenopathy:     Cervical: No cervical adenopathy.  Skin:    Capillary Refill: Capillary refill takes less than 2 seconds.     Coloration: Skin is not jaundiced or pale.  Neurological:     General: No focal deficit present.     Mental Status: She is alert and oriented to person, place, and time.  Psychiatric:        Behavior: Behavior normal.      UC Treatments / Results  Labs (all labs ordered are listed, but only abnormal results are displayed) Labs Reviewed  POCT RAPID STREP A, ED / UC - Abnormal; Notable for the following components:      Result Value  Streptococcus, Group A Screen (Direct) POSITIVE (*)    All other components within normal limits    EKG   Radiology No results found.  Procedures Procedures (including critical care  time)  Medications Ordered in UC Medications - No data to display  Initial Impression / Assessment and Plan / UC Course  I have reviewed the triage vital signs and the nursing notes.  Pertinent labs & imaging results that were available during my care of the patient were reviewed by me and considered in my medical decision making (see chart for details).        Rapid strep is positive.  Amoxicillin is sent in to treat the infection. Tessalon Perles are sent in for her cough so that it does not worsen her throat pain.  She is having exacerbation of her asthma, prednisone is sent in for 5 days and an albuterol inhaler is sent in also.  She is given instructions on how to self schedule with primary care.  Final Clinical Impressions(s) / UC Diagnoses   Final diagnoses:  Strep pharyngitis  Mild intermittent asthma with acute exacerbation     Discharge Instructions      Strep test is positive 2 days into taking the antibiotics, throw away the toothbrush and begin using a new one. Patient is not contagious after 24 hours of antibiotics; a full 10 days should be completed to prevent rheumatic fever  Albuterol inhaler--do 2 puffs every 4 hours as needed for shortness of breath or wheezing  Take prednisone 20 mg--2 daily for 5 days  You can use the QR code/website at the back of the summary paperwork to schedule yourself a new patient appointment with primary care       ED Prescriptions     Medication Sig Dispense Auth. Provider   albuterol (VENTOLIN HFA) 108 (90 Base) MCG/ACT inhaler Inhale 2 puffs into the lungs every 4 (four) hours as needed for wheezing or shortness of breath. 1 each Barrett Henle, MD   amoxicillin (AMOXIL) 875 MG tablet  (Status: Discontinued) Take 1 tablet (875 mg total) by mouth 2 (two) times daily for 7 days. 14 tablet Shannen Vernon, Gwenlyn Perking, MD   predniSONE (DELTASONE) 20 MG tablet Take 2 tablets (40 mg total) by mouth daily with breakfast for 5  days. 10 tablet Barrett Henle, MD   amoxicillin (AMOXIL) 875 MG tablet Take 1 tablet (875 mg total) by mouth 2 (two) times daily for 10 days. 20 tablet Luwana Butrick, Gwenlyn Perking, MD      PDMP not reviewed this encounter.   Barrett Henle, MD 01/20/23 [phone]

## 2023-01-20 NOTE — ED Triage Notes (Signed)
Patient reports that she has a cough, sore throat and sneezing that started yesterday and worse today.  Patient denies taking any medication today.

## 2023-01-20 NOTE — Discharge Instructions (Addendum)
Strep test is positive 2 days into taking the antibiotics, throw away the toothbrush and begin using a new one. Patient is not contagious after 24 hours of antibiotics; a full 10 days should be completed to prevent rheumatic fever  Albuterol inhaler--do 2 puffs every 4 hours as needed for shortness of breath or wheezing  Take prednisone 20 mg--2 daily for 5 days  You can use the QR code/website at the back of the summary paperwork to schedule yourself a new patient appointment with primary care

## 2023-02-01 ENCOUNTER — Encounter (HOSPITAL_COMMUNITY): Payer: Self-pay

## 2023-02-01 ENCOUNTER — Ambulatory Visit (HOSPITAL_COMMUNITY)
Admission: EM | Admit: 2023-02-01 | Discharge: 2023-02-01 | Disposition: A | Discharge status: Home / Self Care | Payer: Medicaid Other | Attending: Internal Medicine | Admitting: Internal Medicine

## 2023-02-01 DIAGNOSIS — L03114 Cellulitis of left upper limb: Secondary | ICD-10-CM | POA: Insufficient documentation

## 2023-02-01 DIAGNOSIS — Z872 Personal history of diseases of the skin and subcutaneous tissue: Secondary | ICD-10-CM | POA: Insufficient documentation

## 2023-02-01 LAB — CBC WITH DIFFERENTIAL/PLATELET
Abs Immature Granulocytes: 0.07 10*3/uL (ref 0.00–0.07)
Basophils Absolute: 0 10*3/uL (ref 0.0–0.1)
Basophils Relative: 0 %
Eosinophils Absolute: 0.1 10*3/uL (ref 0.0–0.5)
Eosinophils Relative: 1 %
HCT: 41.4 % (ref 36.0–46.0)
Hemoglobin: 12.8 g/dL (ref 12.0–15.0)
Immature Granulocytes: 1 %
Lymphocytes Relative: 15 %
Lymphs Abs: 1.9 10*3/uL (ref 0.7–4.0)
MCH: 27.1 pg (ref 26.0–34.0)
MCHC: 30.9 g/dL (ref 30.0–36.0)
MCV: 87.5 fL (ref 80.0–100.0)
Monocytes Absolute: 0.7 10*3/uL (ref 0.1–1.0)
Monocytes Relative: 6 %
Neutro Abs: 9.8 10*3/uL — ABNORMAL HIGH (ref 1.7–7.7)
Neutrophils Relative %: 77 %
Platelets: 371 10*3/uL (ref 150–400)
RBC: 4.73 MIL/uL (ref 3.87–5.11)
RDW: 13.5 % (ref 11.5–15.5)
WBC: 12.7 10*3/uL — ABNORMAL HIGH (ref 4.0–10.5)
nRBC: 0 % (ref 0.0–0.2)

## 2023-02-01 MED ORDER — LIDOCAINE HCL (PF) 1 % IJ SOLN
INTRAMUSCULAR | Status: AC
  Filled 2023-02-01: qty 2

## 2023-02-01 MED ORDER — CEFTRIAXONE SODIUM 1 G IJ SOLR
1.0000 g | Freq: Once | INTRAMUSCULAR | Status: AC
  Administered 2023-02-01: 1 g via INTRAMUSCULAR

## 2023-02-01 MED ORDER — DOXYCYCLINE HYCLATE 100 MG PO CAPS: 100.0000 mg | ORAL_CAPSULE | Freq: Two times a day (BID) | ORAL | 0 refills | Status: DC

## 2023-02-01 MED ORDER — CEFTRIAXONE SODIUM 1 G IJ SOLR
INTRAMUSCULAR | Status: AC
  Filled 2023-02-01: qty 10

## 2023-02-01 NOTE — Discharge Instructions (Signed)
You have been prescribed Doxycycline for your hands. This is an antibiotic often used to treat skin infections. Please take as directed. You were also given an injection in office (Rocephin) which is an antibiotic. If no improvement in 2 days, recommend you return to our office for re-evaluation and possibly another injection.   It is very important for you to pay attention to any new symptoms or worsening of your current condition. Please go directly to the Emergency Department immediately should you begin to feel worse in any way or have any of the following symptoms: persistent fevers, increased pain, increased swelling, increased redness, redness outside the demarcation or streaking up your arm.  Recommend follow up with Dermatology as soon as possible as well. Your blood work was sent to the lab for further testing. Someone from our office will call you with your results.

## 2023-02-01 NOTE — ED Triage Notes (Signed)
Pt reports ongoing bilateral rash on her hands. Pt reports itching and swelling.  Pt reports she is using Triamcinolone cream with no relief.

## 2023-02-01 NOTE — ED Provider Notes (Signed)
[facility]   Note:  This document was prepared using Dragon voice recognition software and may include unintentional dictation errors.  MRN: [id number] DOB: Sep 02, 1991 DATE: 02/01/23   Subjective:  Chief Complaint:  Chief Complaint  Patient presents with   Rash     HPI: Alison Archer is a 32 y.o. female presenting for rash on bilateral hands for the past 4 days.  Patient reports history of intermittent rash for the past several months, but rash has never been this bad before.  She has a history of eczema, but not until recently has it become an issue.  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  She has been treated with steroids and topical triamcinolone in the past; however, the topical steroid and leftover prednisone is doing nothing to help with her current symptoms.  She is starting to have swelling and discharge from her hands bilaterally. It is becoming difficult her to grip and grasp with her left hand.  She is right-hand dominant.  Denies fever, nausea/vomiting. Endorses discharge, swelling, tenderness to palpation. Presents NAD.  Prior to Admission medications   Medication Sig Start Date End Date Taking? Authorizing Provider  albuterol (VENTOLIN HFA) 108 (90 Base) MCG/ACT inhaler Inhale 2 puffs into the lungs every 4 (four) hours as needed for wheezing or shortness of breath. 01/20/23   Barrett Henle, MD  ALBUTEROL IN Inhale into the lungs as needed.    [provider]  triamcinolone cream (KENALOG) 0.1 % Apply 1 Application topically 2 (two) times daily. 01/01/23   Sherrill Raring, PA-C     No Known Allergies  History:   Past Medical History:  Diagnosis Date   Alpha thalassemia silent carrier    Asthma, exercise induced    05-11-2022  per pt inhaler prn   Eczema    History of febrile seizure    febrile as a child   Hypertension    Pre-eclampsia, mild 03/13/2022     Past Surgical  History:  Procedure Laterality Date   CESAREAN SECTION N/A 04/07/2013   Procedure: Primary cesarean section with delivery of baby boy at 56.  Apgars 4/7.;  Surgeon: Lavonia Drafts, MD;  Location: [facility];  Service: Obstetrics;  Laterality: N/A;   LAPAROSCOPIC TUBAL LIGATION Bilateral 05/13/2022   Procedure: LAPAROSCOPIC TUBAL LIGATION WITH FILSHIE CLIPS;  Surgeon: Osborne Oman, MD;  Location: [place] [place];  Service: Gynecology;  Laterality: Bilateral;    Family History  Problem Relation Age of Onset   Hypertension Mother    Hypertension Father    Asthma Sister    Hypertension Sister    Diabetes Maternal Grandmother     Social History   Tobacco Use   Smoking status: Former    Years: 2.00    Types: Cigarettes    Quit date: 01/09/2020    Years since quitting: 3.0   Smokeless tobacco: Never  Vaping Use   Vaping Use: Never used  Substance Use Topics   Alcohol use: No   Drug use: Not Currently    Types: Marijuana    Review of Systems  Constitutional:  Negative for fever.  Gastrointestinal:  Negative for nausea and vomiting.  Musculoskeletal:  Positive for arthralgias.  Skin:  Positive for rash and wound.     Objective:   Vitals: BP (!) 143/85 (BP Location: Left Arm)   Pulse (!) 102   Temp 98 F (36.7 C) (Oral)  Resp 16   LMP 01/11/2023   SpO2 100%   Physical Exam Constitutional:      General: She is not in acute distress.    Appearance: Normal appearance. She is well-developed. She is obese. She is not ill-appearing or toxic-appearing.  HENT:     Head: Normocephalic and atraumatic.  Cardiovascular:     Rate and Rhythm: Normal rate and regular rhythm.     Heart sounds: Normal heart sounds.  Pulmonary:     Effort: Pulmonary effort is normal.     Breath sounds: Normal breath sounds.     Comments: Clear to auscultation bilaterally  Abdominal:     General: Bowel sounds are normal.     Palpations: Abdomen is soft.     Tenderness:  There is no abdominal tenderness.  Skin:    General: Skin is warm and dry.     Findings: Erythema, rash and wound present. Rash is crusting, macular and papular.     Comments: Patient's left hand is noted to have a dark maculopapular rash between digits and on dorsum of hand.  There is skin breakdown noted over left metacarpal heads with purulent discharge.  Nonpitting edema present with erythema.  Decreased range of motion due to pain with flexion.  Patient's right hand has similar appearance with to a lesser degree.  Less skin breakdown noted on right hand.  Patient is neurovascularly intact bilaterally.  Tenderness palpation of the left hand  Neurological:     General: No focal deficit present.     Mental Status: She is alert.  Psychiatric:        Mood and Affect: Mood and affect normal.     Results:  Labs: No results found for this or any previous visit (from the past 24 hour(s)).  Radiology: No results found.   UC Course/Treatments:  Procedures: Procedures   Medications Ordered in UC: Medications  cefTRIAXone (ROCEPHIN) injection 1 g (1 g Intramuscular Given 02/01/23 1411)     Assessment and Plan :     ICD-10-CM   1. Cellulitis of left hand  L03.114     2. History of eczema  Z87.2      Cellulitis:  Afebrile, nontoxic-appearing, NAD. VSS. DDX includes but not limited to: cellulitis secondary to eczema, irritant dermatitis, scabies Concern for possible cellulitis secondary to eczema.  CBC was ordered today in office. Results pending.  Doxycycline and 1 g of Rocephin were ordered to cover for Staph/MRSA.  If no improvement in 2 days, recommend follow-up for another possible gram of Rocephin.  Avoid topical steroids at this time.  Patient instructed to follow-up with Dermatologist soon as possible.  Strict ED precautions were given and patient verbalized understanding.   ED Discharge Orders          Ordered    doxycycline (VIBRAMYCIN) 100 MG capsule  2 times daily        Note to Pharmacy: NPI: [id number]   02/01/23 1401             PDMP not reviewed this encounter.     Carmie End, PA-C 02/01/23 1434

## 2023-02-04 ENCOUNTER — Encounter (HOSPITAL_COMMUNITY): Payer: Self-pay | Admitting: Emergency Medicine

## 2023-02-04 ENCOUNTER — Other Ambulatory Visit: Payer: Self-pay

## 2023-02-04 ENCOUNTER — Emergency Department (HOSPITAL_COMMUNITY)
Admission: EM | Admit: 2023-02-04 | Discharge: 2023-02-04 | Disposition: A | Discharge status: Home / Self Care | Payer: Medicaid Other | Attending: Emergency Medicine | Admitting: Emergency Medicine

## 2023-02-04 DIAGNOSIS — M5441 Lumbago with sciatica, right side: Secondary | ICD-10-CM | POA: Diagnosis not present

## 2023-02-04 DIAGNOSIS — M545 Low back pain, unspecified: Secondary | ICD-10-CM | POA: Diagnosis present

## 2023-02-04 MED ORDER — METHOCARBAMOL 1000 MG PO TABS: 1000.0000 mg | ORAL_TABLET | Freq: Three times a day (TID) | ORAL | 0 refills | Status: DC | PRN

## 2023-02-04 MED ORDER — KETOROLAC TROMETHAMINE 30 MG/ML IJ SOLN
30.0000 mg | Freq: Once | INTRAMUSCULAR | Status: AC
  Administered 2023-02-04: 30 mg via INTRAMUSCULAR
  Filled 2023-02-04: qty 1

## 2023-02-04 NOTE — ED Provider Notes (Signed)
[facility] Provider Note   CSN: [id number] Arrival date & time: 02/04/23  1048     History  Chief Complaint  Patient presents with   Back Pain    Alison Archer is a 32 y.o. female.  HPI 32 year old female presents with low back pain.  She has been dealing with back pain on and off for a long time.  In particular she has been dealing with back pain for the past week or so without trauma.  However last night when she got out of the shower she bent down and all of a sudden felt new back pain in her right low back that radiates down her right leg to her knee.  She is not sure if it radiates on the anterior, posterior or lateral aspect.  No numbness or weakness in her lower extremities.  It hurts to move her leg but there is no weakness.  No fevers, IV drug use, weight loss, or abdominal pain.  She denies any incontinence or saddle anesthesia.  Took some Advil without much relief this morning.  Home Medications Prior to Admission medications   Medication Sig Start Date End Date Taking? Authorizing Provider  methocarbamol 1000 MG TABS Take 1,000 mg by mouth every 8 (eight) hours as needed for up to 5 days for muscle spasms. 02/04/23 02/09/23 Yes Sherwood Gambler, MD  albuterol (VENTOLIN HFA) 108 (90 Base) MCG/ACT inhaler Inhale 2 puffs into the lungs every 4 (four) hours as needed for wheezing or shortness of breath. 01/20/23   Barrett Henle, MD  ALBUTEROL IN Inhale into the lungs as needed.    [provider]  doxycycline (VIBRAMYCIN) 100 MG capsule Take 1 capsule (100 mg total) by mouth 2 (two) times daily for 10 days. 02/01/23 02/11/23  Hermanns, Ashlee P, PA-C  triamcinolone cream (KENALOG) 0.1 % Apply 1 Application topically 2 (two) times daily. 01/01/23   Sherrill Raring, PA-C      Allergies    Patient has no known allergies.    Review of Systems   Review of Systems  Constitutional:  Negative for fever.  Gastrointestinal:  Negative  for abdominal pain.  Musculoskeletal:  Positive for back pain.  Neurological:  Negative for weakness and numbness.    Physical Exam Updated Vital Signs BP 133/87   Pulse 94   Temp 98.2 F (36.8 C) (Oral)   Resp 18   Ht 5\' 5"  (1.651 m)   Wt 102 kg   LMP 01/11/2023   SpO2 99%   BMI 37.42 kg/m  Physical Exam Vitals and nursing note reviewed.  Constitutional:      Appearance: She is well-developed. She is obese.  HENT:     Head: Normocephalic and atraumatic.  Pulmonary:     Effort: Pulmonary effort is normal.  Abdominal:     Palpations: Abdomen is soft.     Tenderness: There is no abdominal tenderness.  Musculoskeletal:     Lumbar back: Tenderness (midline and right side) present.  Skin:    General: Skin is warm and dry.  Neurological:     Mental Status: She is alert.     Comments: 5/5 strength in BLE. Grossly normal sensation     ED Results / Procedures / Treatments   Labs (all labs ordered are listed, but only abnormal results are displayed) Labs Reviewed  PREGNANCY, URINE    EKG None  Radiology No results found.  Procedures Procedures    Medications Ordered in  ED Medications  ketorolac (TORADOL) 30 MG/ML injection 30 mg (has no administration in time range)    ED Course/ Medical Decision Making/ A&P                             Medical Decision Making Risk Prescription drug management.   Pregnancy test and x-ray had been ordered in triage though with no trauma and no other red flags I do not know that x-ray imaging would help today.  I discussed this with patient and we agreed to hold off together and defer x-ray.  Otherwise, patient does not want to wait on a urine pregnancy test.  She states is very unlikely she is pregnant.  I think this is reasonable and we will treat her symptomatically for what sounds like some mild sciatica.  I do not think she needs an emergent MRI based on history and exam.  Will treat with NSAIDs which she already has at  home, advised Tylenol, and give a muscle relaxer.  Will recommend some exercises as well.  Otherwise discussed need for PCP for general outpatient care and follow-up and she will be given return precautions.  Doubt acute spinal or intra-abdominal/retroperitoneal emergency.        Final Clinical Impression(s) / ED Diagnoses Final diagnoses:  Acute right-sided low back pain with right-sided sciatica    Rx / DC Orders ED Discharge Orders          Ordered    methocarbamol 1000 MG TABS  Every 8 hours PRN        02/04/23 1243              Sherwood Gambler, MD 02/04/23 1254

## 2023-02-04 NOTE — ED Provider Triage Note (Signed)
Emergency Medicine Provider Triage Evaluation Note  Alison Archer , a 32 y.o. female  was evaluated in triage.  Pt complains of right-sided back pain for the past week has been gradually improving but worsened last night.  Patient reports that she stepped out of the shower does not know if she stepped of the shower from but started to have that right lower back pain to midline pain.  Radiates into her thigh.  No numbness or tingling.  No back red flag symptoms.  No falls or traumatic injury..  Review of Systems  Positive:  Negative:   Physical Exam  BP 133/87   Pulse 94   Temp 98.2 F (36.8 C) (Oral)   Resp 18   Ht 5\' 5"  (1.651 m)   Wt 102 kg   LMP 01/11/2023   SpO2 99%   BMI 37.42 kg/m  Gen:   Awake, no distress   Resp:  Normal effort  MSK:   Moves extremities without difficulty  Other:  Lower midline and right paraspinal lumbar tenderness palpation.  Sensation equal in patient's legs.  Compartments are soft.  Medical Decision Making  Medically screening exam initiated at 11:21 AM.  Appropriate orders placed.  Aury S Carachure was informed that the remainder of the evaluation will be completed by another provider, this initial triage assessment does not replace that evaluation, and the importance of remaining in the ED until their evaluation is complete.  XR and urine preg ordered   Sherrell Puller, PA-C 02/04/23 1123

## 2023-02-04 NOTE — ED Triage Notes (Signed)
Patient complains of lower mid back pain radiates out to the R side, going down her leg intermittently. States this started last night when she got out of the shower, wasn't sure if she moved wrong but felt it afterwards. States this has happened to her in the past but not to this extent. Denies any numbness or tingling in her legs/ feet.

## 2023-02-04 NOTE — Discharge Instructions (Signed)
If you develop worsening, recurrent, or continued back pain, numbness or weakness in the legs, incontinence of your bowels or bladders, numbness of your buttocks, fever, abdominal pain, or any other new/concerning symptoms then return to the ER for evaluation.  

## 2023-02-08 ENCOUNTER — Encounter (HOSPITAL_COMMUNITY): Payer: Self-pay

## 2023-02-08 ENCOUNTER — Ambulatory Visit (HOSPITAL_COMMUNITY)
Admission: EM | Admit: 2023-02-08 | Discharge: 2023-02-08 | Disposition: A | Discharge status: Home / Self Care | Payer: Medicaid Other | Attending: Family Medicine | Admitting: Family Medicine

## 2023-02-08 DIAGNOSIS — M5441 Lumbago with sciatica, right side: Secondary | ICD-10-CM | POA: Diagnosis not present

## 2023-02-08 MED ORDER — NAPROXEN 500 MG PO TABS: 500.0000 mg | ORAL_TABLET | Freq: Two times a day (BID) | ORAL | 0 refills | Status: AC | PRN

## 2023-02-08 NOTE — ED Provider Notes (Addendum)
[facility]    CSN: [id number] Arrival date & time: 02/08/23  1336      History   Chief Complaint Chief Complaint  Patient presents with   Back Pain    HPI Alison Archer is a 32 y.o. female.    Back Pain  Here for low back pain that is been bothering her since the evening of March 14.  It began suddenly when she had bent down.  No trauma and no fall.  No overuse.  No fever or rash or dysuria or hematuria.  The pain is in the center of her lumbosacral area and to the right.  It radiates down the anterior leg to just above her knee.  No bowel or bladder incontinence and no muscle weakness.  Last menstrual cycle was February 20.  Ibuprofen and Tylenol have not helped.  She has not been [place] to pick up the methocarbamol that was sent in from the ER so far but will be [place] to pick it up today. Past Medical History:  Diagnosis Date   Alpha thalassemia silent carrier    Asthma, exercise induced    05-11-2022  per pt inhaler prn   Eczema    History of febrile seizure    febrile as a child   Hypertension    Pre-eclampsia, mild 03/13/2022    Patient Active Problem List   Diagnosis Date Noted   Request for sterilization 05/13/2022   VBAC (vaginal birth after Cesarean) 03/13/2022   Obesity in pregnancy 12/30/2021    Past Surgical History:  Procedure Laterality Date   CESAREAN SECTION N/A 04/07/2013   Procedure: Primary cesarean section with delivery of baby boy at 59.  Apgars 4/7.;  Surgeon: Lavonia Drafts, MD;  Location: [facility];  Service: Obstetrics;  Laterality: N/A;   LAPAROSCOPIC TUBAL LIGATION Bilateral 05/13/2022   Procedure: LAPAROSCOPIC TUBAL LIGATION WITH FILSHIE CLIPS;  Surgeon: Osborne Oman, MD;  Location: [facility];  Service: Gynecology;  Laterality: Bilateral;    OB History     Gravida  2   Para  2   Term  2   Preterm  0   AB  0   Living  2      SAB  0   IAB  0   Ectopic  0   Multiple  0   Live  Births  2            Home Medications    Prior to Admission medications   Medication Sig Start Date End Date Taking? Authorizing Provider  naproxen (NAPROSYN) 500 MG tablet Take 1 tablet (500 mg total) by mouth 2 (two) times daily as needed (pain). 02/08/23  Yes Barrett Henle, MD  albuterol (VENTOLIN HFA) 108 (90 Base) MCG/ACT inhaler Inhale 2 puffs into the lungs every 4 (four) hours as needed for wheezing or shortness of breath. 01/20/23   Barrett Henle, MD  ALBUTEROL IN Inhale into the lungs as needed.    [provider]  triamcinolone cream (KENALOG) 0.1 % Apply 1 Application topically 2 (two) times daily. 01/01/23   Sherrill Raring, PA-C    Family History Family History  Problem Relation Age of Onset   Hypertension Mother    Hypertension Father    Asthma Sister    Hypertension Sister    Diabetes Maternal Grandmother     Social History Social History   Tobacco Use   Smoking status: Former    Years: 2  Types: Cigarettes    Quit date: 01/09/2020    Years since quitting: 3.0   Smokeless tobacco: Never  Vaping Use   Vaping Use: Never used  Substance Use Topics   Alcohol use: No   Drug use: Not Currently    Types: Marijuana     Allergies   Patient has no known allergies.   Review of Systems Review of Systems  Musculoskeletal:  Positive for back pain.     Physical Exam Triage Vital Signs ED Triage Vitals  Enc Vitals Group     BP 02/08/23 1415 115/74     Pulse Rate 02/08/23 1415 95     Resp 02/08/23 1415 16     Temp 02/08/23 1413 98.6 F (37 C)     Temp Source 02/08/23 1413 Oral     SpO2 02/08/23 1415 96 %     Weight --      Height --      Head Circumference --      Peak Flow --      Pain Score 02/08/23 1414 9     Pain Loc --      Pain Edu? --      Excl. in [place]? --    No data found.  Updated Vital Signs BP 115/74 (BP Location: Right Arm)   Pulse 95   Temp 98.6 F (37 C) (Oral)   Resp 16   LMP 01/11/2023   SpO2 96%    Visual Acuity Right Eye Distance:   Left Eye Distance:   Bilateral Distance:    Right Eye Near:   Left Eye Near:    Bilateral Near:     Physical Exam   UC Treatments / Results  Labs (all labs ordered are listed, but only abnormal results are displayed) Labs Reviewed - No data to display  EKG   Radiology No results found.  Procedures Procedures (including critical care time)  Medications Ordered in UC Medications - No data to display  Initial Impression / Assessment and Plan / UC Course  I have reviewed the triage vital signs and the nursing notes.  Pertinent labs & imaging results that were available during my care of the patient were reviewed by me and considered in my medical decision making (see chart for details).        She states Toradol did not help much when she was seen in the emergency room, so were not doing that today.  Naproxen is sent in 500 mg twice a day as needed for pain.  She will also use the muscle relaxer already sent in by the ER.  I have asked her to follow-up with primary care.  She is given back stretching exercises Final Clinical Impressions(s) / UC Diagnoses   Final diagnoses:  Acute right-sided low back pain with right-sided sciatica     Discharge Instructions      Take naproxen 500 mg--1 tablet every 12 hours as needed for pain  Take the methocarbamol muscle relaxer as prescribed  Heating pad or moist heat in the shower can help how this feels.  Follow-up with your primary care about this issue     ED Prescriptions     Medication Sig Dispense Auth. Provider   naproxen (NAPROSYN) 500 MG tablet Take 1 tablet (500 mg total) by mouth 2 (two) times daily as needed (pain). 30 tablet Rozlyn Yerby, Gwenlyn Perking, MD      I have reviewed the PDMP during this encounter.   Barrett Henle, MD  02/08/23 1444    Barrett Henle, MD 02/08/23 1444

## 2023-02-08 NOTE — Discharge Instructions (Signed)
Take naproxen 500 mg--1 tablet every 12 hours as needed for pain  Take the methocarbamol muscle relaxer as prescribed  Heating pad or moist heat in the shower can help how this feels.  Follow-up with your primary care about this issue

## 2023-02-08 NOTE — ED Triage Notes (Signed)
Pt states right lower back pain radiating down right leg for the past 4 days,  seen in ED for same. States she is not feeling any better.  Has been taking Advil at home states she wasn't Men to pick up the muscle relaxer that was prescribed for her. Pt ambulates with a slow steady gait.

## 2024-01-28 ENCOUNTER — Ambulatory Visit (HOSPITAL_COMMUNITY)
Admission: EM | Admit: 2024-01-28 | Discharge: 2024-01-28 | Disposition: A | Discharge status: Home / Self Care | Attending: Emergency Medicine | Admitting: Emergency Medicine

## 2024-01-28 ENCOUNTER — Other Ambulatory Visit: Payer: Self-pay

## 2024-01-28 ENCOUNTER — Encounter (HOSPITAL_COMMUNITY): Payer: Self-pay | Admitting: Emergency Medicine

## 2024-01-28 DIAGNOSIS — L301 Dyshidrosis [pompholyx]: Secondary | ICD-10-CM | POA: Diagnosis not present

## 2024-01-28 DIAGNOSIS — Z3202 Encounter for pregnancy test, result negative: Secondary | ICD-10-CM

## 2024-01-28 DIAGNOSIS — L089 Local infection of the skin and subcutaneous tissue, unspecified: Secondary | ICD-10-CM | POA: Diagnosis not present

## 2024-01-28 DIAGNOSIS — B9689 Other specified bacterial agents as the cause of diseases classified elsewhere: Secondary | ICD-10-CM | POA: Diagnosis not present

## 2024-01-28 DIAGNOSIS — R21 Rash and other nonspecific skin eruption: Secondary | ICD-10-CM

## 2024-01-28 LAB — POCT URINE PREGNANCY: Preg Test, Ur: NEGATIVE

## 2024-01-28 MED ORDER — PREDNISONE 10 MG (21) PO TBPK: ORAL_TABLET | Freq: Every day | ORAL | 0 refills | Status: AC

## 2024-01-28 MED ORDER — CEPHALEXIN 500 MG PO CAPS: 500.0000 mg | ORAL_CAPSULE | Freq: Two times a day (BID) | ORAL | 0 refills | Status: AC

## 2024-01-28 NOTE — Discharge Instructions (Addendum)
 Prednisone taper - starting today, take 6 pills. Tomorrow, take 5 pills. Continue decreasing by 1 pill daily until finished.  Antibiotic -- take keflex twice daily for 7 days in a row. Take with food to avoid upset stomach.  Please contact your dermatologist first thing Monday to make an appointment for follow up

## 2024-01-28 NOTE — ED Provider Notes (Signed)
 [facility]    CSN: [id number] Arrival date & time: 01/28/24  1046     History   Chief Complaint Chief Complaint  Patient presents with   Rash    HPI Alison Archer is a 33 y.o. female.  Here with rash on bilateral hands, itching and painful She has had this flare on and off for the last year or so.  Worsening over the past week.  History of eczema. Denies any swelling of lips or tongue, no shortness of breath, nausea or vomiting.  Has a dermatologist - seen last year  LMP 2/6  Past Medical History:  Diagnosis Date   Alpha thalassemia silent carrier    Asthma, exercise induced    05-11-2022  per pt inhaler prn   Eczema    History of febrile seizure    febrile as a child   Hypertension    Pre-eclampsia, mild 03/13/2022    Patient Active Problem List   Diagnosis Date Noted   Request for sterilization 05/13/2022   VBAC (vaginal birth after Cesarean) 03/13/2022   Obesity in pregnancy 12/30/2021    Past Surgical History:  Procedure Laterality Date   CESAREAN SECTION N/A 04/07/2013   Procedure: Primary cesarean section with delivery of baby boy at 49.  Apgars 4/7.;  Surgeon: Willodean Rosenthal, MD;  Location: [facility];  Service: Obstetrics;  Laterality: N/A;   LAPAROSCOPIC TUBAL LIGATION Bilateral 05/13/2022   Procedure: LAPAROSCOPIC TUBAL LIGATION WITH FILSHIE CLIPS;  Surgeon: Tereso Newcomer, MD;  Location: [facility];  Service: Gynecology;  Laterality: Bilateral;    OB History     Gravida  2   Para  2   Term  2   Preterm  0   AB  0   Living  2      SAB  0   IAB  0   Ectopic  0   Multiple  0   Live Births  2            Home Medications    Prior to Admission medications   Medication Sig Start Date End Date Taking? Authorizing Provider  cephALEXin (KEFLEX) 500 MG capsule Take 1 capsule (500 mg total) by mouth 2 (two) times daily for 7 days. 01/28/24 02/04/24 Yes Alazar Cherian, Lurena Joiner, PA-C  predniSONE (STERAPRED  UNI-PAK 21 TAB) 10 MG (21) TBPK tablet Take by mouth daily. Please take as directed - decrease by 1 tablet each day 01/28/24  Yes Aleria Maheu, Lurena Joiner, PA-C  albuterol (VENTOLIN HFA) 108 (90 Base) MCG/ACT inhaler Inhale 2 puffs into the lungs every 4 (four) hours as needed for wheezing or shortness of breath. 01/20/23   Zenia Resides, MD  ALBUTEROL IN Inhale into the lungs as needed.    [provider]  naproxen (NAPROSYN) 500 MG tablet Take 1 tablet (500 mg total) by mouth 2 (two) times daily as needed (pain). 02/08/23   Zenia Resides, MD  triamcinolone cream (KENALOG) 0.1 % Apply 1 Application topically 2 (two) times daily. 01/01/23   Theron Arista, PA-C    Family History Family History  Problem Relation Age of Onset   Hypertension Mother    Hypertension Father    Asthma Sister    Hypertension Sister    Diabetes Maternal Grandmother     Social History Social History   Tobacco Use   Smoking status: Former    Current packs/day: 0.00    Types: Cigarettes    Start date: 01/08/2018  Quit date: 01/09/2020    Years since quitting: 4.0   Smokeless tobacco: Never  Vaping Use   Vaping status: Never Used  Substance Use Topics   Alcohol use: No   Drug use: Not Currently    Types: Marijuana     Allergies   Patient has no known allergies.   Review of Systems Review of Systems  Skin:  Positive for rash.   Per HPI  Physical Exam Triage Vital Signs ED Triage Vitals [01/28/24 1140]  Encounter Vitals Group     BP 135/78     Systolic BP Percentile      Diastolic BP Percentile      Pulse Rate 100     Resp 15     Temp 98.2 F (36.8 C)     Temp Source Oral     SpO2 99 %     Weight      Height      Head Circumference      Peak Flow      Pain Score 4     Pain Loc      Pain Education      Exclude from Growth Chart    No data found.  Updated Vital Signs BP 135/78 (BP Location: Right Arm)   Pulse 100   Temp 98.2 F (36.8 C) (Oral)   Resp 15   LMP 12/29/2023  (Approximate)   SpO2 99%    Physical Exam Vitals and nursing note reviewed.  Constitutional:      General: She is not in acute distress.    Appearance: Normal appearance.  HENT:     Mouth/Throat:     Mouth: Mucous membranes are moist.     Pharynx: Oropharynx is clear. No posterior oropharyngeal erythema.  Eyes:     Conjunctiva/sclera: Conjunctivae normal.     Pupils: Pupils are equal, round, and reactive to light.  Cardiovascular:     Rate and Rhythm: Normal rate and regular rhythm.     Pulses: Normal pulses.     Heart sounds: Normal heart sounds.  Pulmonary:     Effort: Pulmonary effort is normal. No respiratory distress.     Breath sounds: Normal breath sounds. No wheezing.  Abdominal:     General: Bowel sounds are normal.     Tenderness: There is no abdominal tenderness.  Musculoskeletal:        General: Normal range of motion.     Cervical back: Normal range of motion.  Skin:    Findings: Rash present.     Comments: Dyshidrotic eczema on the fingers and extending to upper palms. There are firm nontender vesicular lesions scattered. Erythema and some yellow scaly skin along with peeling  Neurological:     Mental Status: She is alert and oriented to person, place, and time.      UC Treatments / Results  Labs (all labs ordered are listed, but only abnormal results are displayed) Labs Reviewed  POCT URINE PREGNANCY    EKG  Radiology No results found.  Procedures Procedures (including critical care time)  Medications Ordered in UC Medications - No data to display  Initial Impression / Assessment and Plan / UC Course  I have reviewed the triage vital signs and the nursing notes.  Pertinent labs & imaging results that were available during my care of the patient were reviewed by me and considered in my medical decision making (see chart for details).  UPT negative Offered IM steroid for itching but patient declines 6  day steroid taper is prescribed. Keflex  BID x 7 days for likely secondary bacterial infection. She does have a dermatologist, I have advised contacting for thing Monday morning to set up an appointment for follow-up.  Final Clinical Impressions(s) / UC Diagnoses   Final diagnoses:  Dyshydrosis  Rash  Bacterial skin infection     Discharge Instructions      Prednisone taper - starting today, take 6 pills. Tomorrow, take 5 pills. Continue decreasing by 1 pill daily until finished.  Antibiotic -- take keflex twice daily for 7 days in a row. Take with food to avoid upset stomach.  Please contact your dermatologist first thing Monday to make an appointment for follow up     ED Prescriptions     Medication Sig Dispense Auth. Provider   predniSONE (STERAPRED UNI-PAK 21 TAB) 10 MG (21) TBPK tablet Take by mouth daily. Please take as directed - decrease by 1 tablet each day 21 tablet Nailani Full, PA-C   cephALEXin (KEFLEX) 500 MG capsule Take 1 capsule (500 mg total) by mouth 2 (two) times daily for 7 days. 14 capsule Rochanda Harpham, Lurena Joiner, PA-C      PDMP not reviewed this encounter.   Marlow Baars, [place] 01/28/24 1354

## 2024-01-28 NOTE — ED Triage Notes (Signed)
 Bilateral chronic skin irritation getting worse with soreness today. Hx of contact dermatitis.
